# Patient Record
Sex: Male | Born: 1940 | Race: White | Hispanic: No | Marital: Married | Smoking: Never smoker
Health system: Southern US, Community
[De-identification: ages and names within clinical notes are randomized; demographics above are authoritative.]

## PROBLEM LIST (undated history)

## (undated) DIAGNOSIS — E119 Type 2 diabetes mellitus without complications: Secondary | ICD-10-CM

## (undated) DIAGNOSIS — I340 Nonrheumatic mitral (valve) insufficiency: Secondary | ICD-10-CM

## (undated) DIAGNOSIS — I10 Essential (primary) hypertension: Secondary | ICD-10-CM

## (undated) DIAGNOSIS — I2510 Atherosclerotic heart disease of native coronary artery without angina pectoris: Secondary | ICD-10-CM

## (undated) DIAGNOSIS — I509 Heart failure, unspecified: Secondary | ICD-10-CM

## (undated) HISTORY — PX: HERNIA REPAIR: SHX51

## (undated) HISTORY — PX: CARDIAC CATHETERIZATION: SHX172

## (undated) HISTORY — PX: EYE SURGERY: SHX253

---

## 2004-11-21 ENCOUNTER — Emergency Department: Payer: Self-pay | Admitting: Emergency Medicine

## 2005-02-03 ENCOUNTER — Other Ambulatory Visit: Payer: Self-pay

## 2005-02-03 ENCOUNTER — Emergency Department: Payer: Self-pay | Admitting: Unknown Physician Specialty

## 2006-06-30 ENCOUNTER — Emergency Department: Payer: Self-pay | Admitting: Emergency Medicine

## 2007-02-26 ENCOUNTER — Emergency Department: Payer: Self-pay

## 2008-01-16 ENCOUNTER — Other Ambulatory Visit: Payer: Self-pay

## 2008-01-16 ENCOUNTER — Emergency Department: Payer: Self-pay | Admitting: Emergency Medicine

## 2008-05-08 ENCOUNTER — Ambulatory Visit: Payer: Self-pay | Admitting: Urology

## 2008-08-11 ENCOUNTER — Ambulatory Visit: Payer: Self-pay | Admitting: Cardiovascular Disease

## 2009-07-26 ENCOUNTER — Inpatient Hospital Stay: Payer: Self-pay | Admitting: Internal Medicine

## 2009-10-29 ENCOUNTER — Inpatient Hospital Stay: Payer: Self-pay | Admitting: Internal Medicine

## 2012-03-24 ENCOUNTER — Ambulatory Visit: Payer: Self-pay | Admitting: Ophthalmology

## 2012-03-24 LAB — POTASSIUM: Potassium: 4.3 mmol/L (ref 3.5–5.1)

## 2012-04-06 ENCOUNTER — Ambulatory Visit: Payer: Self-pay | Admitting: Ophthalmology

## 2012-05-11 ENCOUNTER — Ambulatory Visit: Payer: Self-pay | Admitting: Ophthalmology

## 2013-07-18 ENCOUNTER — Emergency Department: Payer: Self-pay | Admitting: Emergency Medicine

## 2013-07-18 LAB — URINALYSIS, COMPLETE
Bilirubin,UR: NEGATIVE
Blood: NEGATIVE
GLUCOSE, UR: NEGATIVE mg/dL (ref 0–75)
KETONE: NEGATIVE
LEUKOCYTE ESTERASE: NEGATIVE
Nitrite: NEGATIVE
PH: 5 (ref 4.5–8.0)
RBC,UR: <1 /HPF (ref 0–5)
SPECIFIC GRAVITY: 1.015 (ref 1.003–1.030)
Squamous Epithelial: NONE SEEN
WBC UR: <1 /HPF (ref 0–5)

## 2014-09-26 NOTE — Op Note (Signed)
PATIENT NAME:  Kevin Kelley, [name] MR#:  [id number] DATE OF BIRTH:  September 06, 1940  DATE OF PROCEDURE:  05/11/2012  PREOPERATIVE DIAGNOSIS: Visually significant cataract of the right eye.   POSTOPERATIVE DIAGNOSIS: Visually significant cataract of the right eye.   OPERATIVE PROCEDURE: Cataract extraction by phacoemulsification with implant of intraocular lens to right eye.   SURGEON: Galen ManilaWilliam Leza Apsey, MD.   ANESTHESIA:  1. Managed anesthesia care.  2. Topical tetracaine drops followed by 2% Xylocaine jelly applied in the preoperative holding area.   COMPLICATIONS: None.   TECHNIQUE:  Stop and chop.   DESCRIPTION OF PROCEDURE: The patient was examined and consented in the preoperative holding area where the aforementioned topical anesthesia was applied to the right eye and then brought back to the Operating Room where the right eye was prepped and draped in the usual sterile ophthalmic fashion and a lid speculum was placed. A paracentesis was created with the side port blade and the anterior chamber was filled with viscoelastic. A near clear corneal incision was performed with the steel keratome. A continuous curvilinear capsulorrhexis was performed with a cystotome followed by the capsulorrhexis forceps. Hydrodissection and hydrodelineation were carried out with BSS on a blunt cannula. The lens was removed in a stop and chop technique and the remaining cortical material was removed with the irrigation-aspiration handpiece. The capsular bag was inflated with viscoelastic and the Tecnis ZCB00 23.0-diopter lens, serial number [id number] was placed in the capsular bag without complication. The remaining viscoelastic was removed from the eye with the irrigation-aspiration handpiece. The wounds were hydrated. The anterior chamber was flushed with Miostat and the eye was inflated to physiologic pressure. The wounds were found to be water tight. The eye was dressed with Vigamox. The patient was given protective  glasses to wear throughout the day and a shield with which to sleep tonight. The patient was also given drops with which to begin a drop regimen today and will follow-up with me in one day.  ____________________________ Jerilee FieldWilliam L. Angelys Yetman, MD wlp:slb D: 05/11/2012 20:15:07 ET T: 05/12/2012 08:34:58 ET JOB#: [id number]  cc: Mckaylah Bettendorf L. Demarkis Gheen, MD, <Dictator> Jerilee FieldWILLIAM L Cypher Paule MD ELECTRONICALLY SIGNED 05/13/2012 15:45

## 2014-09-26 NOTE — Op Note (Signed)
PATIENT NAME:  Kevin Kelley, Kevin Kelley MR#:  [id number] DATE OF BIRTH:  [birth year]  DATE OF PROCEDURE:  04/06/2012  PREOPERATIVE DIAGNOSIS: Visually significant cataract of the left eye.   POSTOPERATIVE DIAGNOSIS: Visually significant cataract of the left eye.   OPERATIVE PROCEDURE: Cataract extraction by phacoemulsification with implant of intraocular lens to left eye.   SURGEON: Galen ManilaWilliam Kalai Baca, MD.   ANESTHESIA:  1. Managed anesthesia care.  2. Topical tetracaine drops followed by 2% Xylocaine jelly applied in the preoperative holding area.   COMPLICATIONS: None.   TECHNIQUE:  Stop and chop.   DESCRIPTION OF PROCEDURE: The patient was examined and consented in the preoperative holding area where the aforementioned topical anesthesia was applied to the left eye and then brought back to the Operating Room where the left eye was prepped and draped in the usual sterile ophthalmic fashion and a lid speculum was placed. A paracentesis was created with the side port blade and the anterior chamber was filled with viscoelastic. A near clear corneal incision was performed with the steel keratome. A continuous curvilinear capsulorrhexis was performed with a cystotome followed by the capsulorrhexis forceps. Hydrodissection and hydrodelineation were carried out with BSS on a blunt cannula. The lens was removed in a stop and chop technique and the remaining cortical material was removed with the irrigation-aspiration handpiece. The capsular bag was inflated with viscoelastic and the Tecnis ZCB00 22.5-diopter lens, serial number [id number] was placed in the capsular bag without complication. The remaining viscoelastic was removed from the eye with the irrigation-aspiration handpiece. The wounds were hydrated. The anterior chamber was flushed with Miostat and the eye was inflated to physiologic pressure. The wounds were found to be water tight. The eye was dressed with Vigamox. The patient was given protective glasses  to wear throughout the day and a shield with which to sleep tonight. The patient was also given drops with which to begin a drop regimen today and will follow-up with me in one day.  ____________________________ Kevin FieldWilliam L. Nilton Lave, MD wlp:slb D: 04/06/2012 15:29:47 ET T: 04/06/2012 15:51:18 ET JOB#: [id number]  cc: Kevin Rother L. Zynia Wojtowicz, MD, <Dictator> Kevin FieldWILLIAM L Kshawn Canal MD ELECTRONICALLY SIGNED 04/15/2012 10:23

## 2015-05-07 ENCOUNTER — Encounter: Payer: Self-pay | Admitting: Emergency Medicine

## 2015-05-07 ENCOUNTER — Emergency Department
Admission: EM | Admit: 2015-05-07 | Discharge: 2015-05-07 | Disposition: A | Discharge status: Home / Self Care | Payer: Medicare PPO | Attending: Emergency Medicine | Admitting: Emergency Medicine

## 2015-05-07 ENCOUNTER — Emergency Department: Payer: Medicare PPO

## 2015-05-07 DIAGNOSIS — E119 Type 2 diabetes mellitus without complications: Secondary | ICD-10-CM | POA: Insufficient documentation

## 2015-05-07 DIAGNOSIS — Z7982 Long term (current) use of aspirin: Secondary | ICD-10-CM | POA: Diagnosis not present

## 2015-05-07 DIAGNOSIS — Z79899 Other long term (current) drug therapy: Secondary | ICD-10-CM | POA: Diagnosis not present

## 2015-05-07 DIAGNOSIS — M25551 Pain in right hip: Secondary | ICD-10-CM | POA: Diagnosis present

## 2015-05-07 HISTORY — DX: Type 2 diabetes mellitus without complications: E11.9

## 2015-05-07 HISTORY — DX: Atherosclerotic heart disease of native coronary artery without angina pectoris: I25.10

## 2015-05-07 MED ORDER — TRAMADOL HCL 50 MG PO TABS: 50.0000 mg | ORAL_TABLET | Freq: Four times a day (QID) | ORAL | Status: AC | PRN

## 2015-05-07 NOTE — ED Provider Notes (Signed)
[facility] Emergency Department Provider Note  ____________________________________________  Time seen: Approximately 11:04 AM  I have reviewed the triage vital signs and the nursing notes.   HISTORY  Chief Complaint Hip Pain  HPI Kevin Kelley is a 74 y.o. male is here with  complaint of right hip pain beginning on 4 days ago. Patient states there was no injury to his hip. He states that he was at work on Thursday and was walking more than usual when he developed a limp. His continue to walk since that time. He is tried some over-the-counter medication without any improvement. He denies any previous problems with his hip. Currently patient states that his pain is 10 over 10.     Past Medical History  Diagnosis Date  . Diabetes mellitus without complication (HCC)   . Coronary artery disease     There are no active problems to display for this patient.   Past Surgical History  Procedure Laterality Date  . Eye surgery    . Hernia repair      Current Outpatient Rx  Name  Route  Sig  Dispense  Refill  . amLODipine (NORVASC) 10 MG tablet   Oral   Take 10 mg by mouth daily.         Marland Kitchen aspirin 81 MG tablet   Oral   Take 81 mg by mouth daily.         . furosemide (LASIX) 20 MG tablet   Oral   Take 20 mg by mouth.         Marland Kitchen glipiZIDE (GLUCOTROL) 10 MG tablet   Oral   Take 10 mg by mouth 2 (two) times daily before a meal.         . ibuprofen (ADVIL,MOTRIN) 200 MG tablet   Oral   Take 200 mg by mouth every 6 (six) hours as needed (toke 800 mg at 0700 today 05/07/15).         Marland Kitchen lisinopril (PRINIVIL,ZESTRIL) 20 MG tablet   Oral   Take 20 mg by mouth daily.         . metoprolol (LOPRESSOR) 50 MG tablet   Oral   Take 50 mg by mouth 2 (two) times daily.         Marland Kitchen omeprazole (PRILOSEC) 20 MG capsule   Oral   Take 20 mg by mouth daily.         Marland Kitchen oxybutynin (DITROPAN-XL) 5 MG 24 hr tablet   Oral   Take 5 mg by mouth at  bedtime.         . potassium chloride (K-DUR) 10 MEQ tablet   Oral   Take 10 mEq by mouth daily.         . potassium chloride (K-DUR,KLOR-CON) 10 MEQ tablet   Oral   Take 30 mEq by mouth once.         . pravastatin (PRAVACHOL) 40 MG tablet   Oral   Take 40 mg by mouth daily.         . traMADol (ULTRAM) 50 MG tablet   Oral   Take 1 tablet (50 mg total) by mouth every 6 (six) hours as needed.   20 tablet   0     Allergies Review of patient's allergies indicates no known allergies.  No family history on file.  Social History Social History  Substance Use Topics  . Smoking status: Never Smoker   . Smokeless tobacco: None  . Alcohol Use: None  Review of Systems Constitutional: No fever/chills Eyes: No visual changes. Cardiovascular: Denies chest pain. Respiratory: Denies shortness of breath. Gastrointestinal:  No nausea, no vomiting.  Musculoskeletal: Negative for back pain. Positive right hip pain. Skin: Negative for rash. Neurological: Negative for headaches, focal weakness or numbness.  10-point ROS otherwise negative.  ____________________________________________   PHYSICAL EXAM:  VITAL SIGNS: ED Triage Vitals  Enc Vitals Group     BP 05/07/15 1021 133/63 mmHg     Pulse Rate 05/07/15 1021 51     Resp 05/07/15 1021 18     Temp 05/07/15 1021 97.6 F (36.4 C)     Temp Source 05/07/15 1021 Oral     SpO2 05/07/15 1021 99 %     Weight 05/07/15 1021 198 lb (89.812 kg)     Height 05/07/15 1021 5\' 7"  (1.702 m)     Head Cir --      Peak Flow --      Pain Score 05/07/15 1022 10     Pain Loc --      Pain Edu? --      Excl. in GC? --     Constitutional: Alert and oriented. Well appearing and in no acute distress. Eyes: Conjunctivae are normal. PERRL. EOMI. Head: Atraumatic. Nose: No congestion/rhinnorhea. Neck: No stridor.   Cardiovascular: Normal rate, regular rhythm. Grossly normal heart sounds.  Good peripheral circulation. Respiratory:  Normal respiratory effort.  No retractions. Lungs CTAB. Gastrointestinal: Soft and nontender. No distention. Musculoskeletal: Examination of the right hip but no gross deformity was noted. There is restriction on range of motion in abduction and abduction. Patient is able to flex his knee and range of motion is somewhat uncomfortable but able. No deformity of the knees are noted bilaterally. Neurologic:  Normal speech and language. No gross focal neurologic deficits are appreciated. No gait instability. Skin:  Skin is warm, dry and intact. No rash noted. No erythema or ecchymosis is noted. Psychiatric: Mood and affect are normal. Speech and behavior are normal.  ____________________________________________   LABS (all labs ordered are listed, but only abnormal results are displayed)  Labs Reviewed - No data to display RADIOLOGY  Right hip no acute fracture or dislocation per radiologist. Beaulah CorinI, Rhonda L Summers, personally viewed and evaluated these images (plain radiographs) as part of my medical decision making.  ____________________________________________   PROCEDURES  Procedure(s) performed: None  Critical Care performed: No  ____________________________________________   INITIAL IMPRESSION / ASSESSMENT AND PLAN / ED COURSE  Pertinent labs & imaging results that were available during my care of the patient were reviewed by me and considered in my medical decision making (see chart for details).  Patient is given prescriptions for tramadol as needed for pain. He is follow-up with his primary care doctor if any continued problems. He was also given the name of the orthopedist that was on call today should he decide to follow-up with them as well. He is aware that tramadol could cause drowsiness which may increase his chances of following. ____________________________________________   FINAL CLINICAL IMPRESSION(S) / ED DIAGNOSES  Final diagnoses:  Hip pain, acute, right       Tommi RumpsRhonda L Summers, PA-C 05/07/15 1558  Emily FilbertJonathan E Williams, MD 05/07/15 [phone]

## 2015-05-07 NOTE — ED Notes (Signed)
AAOx3.  Skin warm and dry.  NAD 

## 2015-05-07 NOTE — ED Notes (Signed)
Was at work on Thursday , complaining of right hip pain , no injury recalled , pt ambulating with a mild limp.

## 2015-05-07 NOTE — Discharge Instructions (Signed)
[place] with your [place] in [place] if any continued problems with your hip. Tramadol as needed for pain. The aware this medication could cause drowsiness and that he should not operate machinery or drive.

## 2015-11-27 ENCOUNTER — Emergency Department
Admission: EM | Admit: 2015-11-27 | Discharge: 2015-11-27 | Disposition: A | Discharge status: Home / Self Care | Payer: Medicare PPO | Attending: Emergency Medicine | Admitting: Emergency Medicine

## 2015-11-27 ENCOUNTER — Emergency Department: Payer: Medicare PPO

## 2015-11-27 DIAGNOSIS — I2510 Atherosclerotic heart disease of native coronary artery without angina pectoris: Secondary | ICD-10-CM | POA: Insufficient documentation

## 2015-11-27 DIAGNOSIS — E860 Dehydration: Secondary | ICD-10-CM

## 2015-11-27 DIAGNOSIS — Z79899 Other long term (current) drug therapy: Secondary | ICD-10-CM | POA: Insufficient documentation

## 2015-11-27 DIAGNOSIS — J189 Pneumonia, unspecified organism: Secondary | ICD-10-CM | POA: Insufficient documentation

## 2015-11-27 DIAGNOSIS — Z7982 Long term (current) use of aspirin: Secondary | ICD-10-CM | POA: Insufficient documentation

## 2015-11-27 DIAGNOSIS — R0602 Shortness of breath: Secondary | ICD-10-CM | POA: Diagnosis present

## 2015-11-27 DIAGNOSIS — E119 Type 2 diabetes mellitus without complications: Secondary | ICD-10-CM | POA: Diagnosis not present

## 2015-11-27 LAB — BASIC METABOLIC PANEL
ANION GAP: 10 (ref 5–15)
BUN: 24 mg/dL — AB (ref 6–20)
CALCIUM: 9 mg/dL (ref 8.9–10.3)
CO2: 21 mmol/L — AB (ref 22–32)
Chloride: 96 mmol/L — ABNORMAL LOW (ref 101–111)
Creatinine, Ser: 1.39 mg/dL — ABNORMAL HIGH (ref 0.61–1.24)
GFR calc Af Amer: 56 mL/min — ABNORMAL LOW (ref >60)
GFR, EST NON AFRICAN AMERICAN: 48 mL/min — AB (ref >60)
GLUCOSE: 74 mg/dL (ref 65–99)
POTASSIUM: 5.1 mmol/L (ref 3.5–5.1)
Sodium: 127 mmol/L — ABNORMAL LOW (ref 135–145)

## 2015-11-27 LAB — CBC
HEMATOCRIT: 36.4 % — AB (ref 40.0–52.0)
HEMOGLOBIN: 12.8 g/dL — AB (ref 13.0–18.0)
MCH: 31.2 pg (ref 26.0–34.0)
MCHC: 35.1 g/dL (ref 32.0–36.0)
MCV: 89 fL (ref 80.0–100.0)
Platelets: 267 10*3/uL (ref 150–440)
RBC: 4.09 MIL/uL — ABNORMAL LOW (ref 4.40–5.90)
RDW: 13.3 % (ref 11.5–14.5)
WBC: 8.8 10*3/uL (ref 3.8–10.6)

## 2015-11-27 LAB — TROPONIN I: Troponin I: <0.03 ng/mL (ref <0.031)

## 2015-11-27 MED ORDER — SODIUM CHLORIDE 0.9 % IV BOLUS (SEPSIS)
1000.0000 mL | Freq: Once | INTRAVENOUS | Status: AC
  Administered 2015-11-27: 1000 mL via INTRAVENOUS

## 2015-11-27 MED ORDER — ONDANSETRON 4 MG PO TBDP: 4.0000 mg | ORAL_TABLET | Freq: Three times a day (TID) | ORAL | Status: DC | PRN

## 2015-11-27 MED ORDER — AZITHROMYCIN 500 MG PO TABS
500.0000 mg | ORAL_TABLET | Freq: Once | ORAL | Status: AC
Start: 2015-11-27 — End: 2015-11-27
  Administered 2015-11-27: 500 mg via ORAL
  Filled 2015-11-27: qty 1

## 2015-11-27 MED ORDER — AZITHROMYCIN 250 MG PO TABS: ORAL_TABLET | ORAL | Status: AC

## 2015-11-27 NOTE — ED Notes (Signed)
Patient presents to the ED with shortness of breath and chest pain that began yesterday evening.  Patient states, "when I cough it feels like my chest is going to explode."  Patient reports history of pneumonia.  Patient states cough has produced green phlegm.  Patient is in no obvious distress at this time.

## 2015-11-27 NOTE — ED Notes (Signed)
Pt discharged to home.  Family member driving.  Discharge instructions reviewed.  Verbalized understanding.  No questions or concerns at this time.  Teach back verified.  Pt in NAD.  No items left in ED.   

## 2015-11-27 NOTE — ED Provider Notes (Signed)
[name] Emergency Department Provider Note  ____________________________________________  Time seen: 5:50 PM  I have reviewed the triage vital signs and the nursing notes.   HISTORY  Chief Complaint Shortness of Breath    HPI Kevin Kelley is a 75 y.o. male who complains of shortness of breath and productive cough and chest discomfort only when coughing for the past 2 or 3 days. No fevers chills or night sweats. Did vomit once today, has some nausea ongoing but no diarrhea or abdominal pain. No exertional symptoms. Not pleuritic. Pain is described as aching, no other aggravating or alleviating factors. Nonradiating. Moderate intensity. Intermittent episodes last just a few seconds.  Patient reports normal oral intake.   Past Medical History  Diagnosis Date  . Diabetes mellitus without complication (HCC)   . Coronary artery disease      There are no active problems to display for this patient.    Past Surgical History  Procedure Laterality Date  . Eye surgery    . Hernia repair       Current Outpatient Rx  Name  Route  Sig  Dispense  Refill  . amLODipine (NORVASC) 10 MG tablet   Oral   Take 10 mg by mouth daily.         Marland Kitchen aspirin 81 MG tablet   Oral   Take 81 mg by mouth daily.         Marland Kitchen azithromycin (ZITHROMAX Z-PAK) 250 MG tablet      Take 2 tablets (500 mg) on  Day 1,  followed by 1 tablet (250 mg) once daily on Days 2 through 5.   6 each   0   . furosemide (LASIX) 20 MG tablet   Oral   Take 20 mg by mouth.         Marland Kitchen glipiZIDE (GLUCOTROL) 10 MG tablet   Oral   Take 10 mg by mouth 2 (two) times daily before a meal.         . ibuprofen (ADVIL,MOTRIN) 200 MG tablet   Oral   Take 200 mg by mouth every 6 (six) hours as needed (toke 800 mg at 0700 today 05/07/15).         Marland Kitchen lisinopril (PRINIVIL,ZESTRIL) 20 MG tablet   Oral   Take 20 mg by mouth daily.         . metoprolol (LOPRESSOR) 50 MG tablet   Oral  Take 50 mg by mouth 2 (two) times daily.         Marland Kitchen omeprazole (PRILOSEC) 20 MG capsule   Oral   Take 20 mg by mouth daily.         . ondansetron (ZOFRAN ODT) 4 MG disintegrating tablet   Oral   Take 1 tablet (4 mg total) by mouth every 8 (eight) hours as needed for nausea or vomiting.   20 tablet   0   . oxybutynin (DITROPAN-XL) 5 MG 24 hr tablet   Oral   Take 5 mg by mouth at bedtime.         . potassium chloride (K-DUR) 10 MEQ tablet   Oral   Take 10 mEq by mouth daily.         . potassium chloride (K-DUR,KLOR-CON) 10 MEQ tablet   Oral   Take 30 mEq by mouth once.         . pravastatin (PRAVACHOL) 40 MG tablet   Oral   Take 40 mg by mouth daily.         Marland Kitchen  traMADol (ULTRAM) 50 MG tablet   Oral   Take 1 tablet (50 mg total) by mouth every 6 (six) hours as needed.   20 tablet   0      Allergies Review of patient's allergies indicates no known allergies.   No family history on file.  Social History Social History  Substance Use Topics  . Smoking status: Never Smoker   . Smokeless tobacco: Not on file  . Alcohol Use: Not on file    Review of Systems  Constitutional:   No fever or chills.  Eyes:   No vision changes.  ENT:   No sore throat. No rhinorrhea. Cardiovascular:   Positive as above chest pain. Respiratory:   Positive shortness of breath and productive cough. Gastrointestinal:   Negative for abdominal pain, vomiting and diarrhea.  Genitourinary:   Negative for dysuria or difficulty urinating. Musculoskeletal:   Negative for focal pain or swelling Neurological:   Negative for headaches 10-point ROS otherwise negative.  ____________________________________________   PHYSICAL EXAM:  VITAL SIGNS: ED Triage Vitals  Enc Vitals Group     BP 11/27/15 1428 105/80 mmHg     Pulse Rate 11/27/15 1428 61     Resp 11/27/15 1428 18     Temp 11/27/15 1428 98 F (36.7 C)     Temp Source 11/27/15 1428 Oral     SpO2 11/27/15 1428 97 %      Weight 11/27/15 1428 199 lb (90.266 kg)     Height 11/27/15 1428  (1.702 m)     Head Cir --      Peak Flow --      Pain Score 11/27/15 1726 9     Pain Loc --      Pain Edu? --      Excl. in GC? --     Vital signs reviewed, nursing assessments reviewed.   Constitutional:   Alert and oriented. Well appearing and in no distress. Eyes:   No scleral icterus. No conjunctival pallor. PERRL. EOMI.  No nystagmus. ENT   Head:   Normocephalic and atraumatic.   Nose:   No congestion/rhinnorhea. No septal hematoma   Mouth/Throat:   MMM, no pharyngeal erythema. No peritonsillar mass.    Neck:   No stridor. No SubQ emphysema. No meningismus. Hematological/Lymphatic/Immunilogical:   No cervical lymphadenopathy. Cardiovascular:   RRR. Symmetric bilateral radial and DP pulses.  No murmurs.  Respiratory:   Normal respiratory effort without tachypnea nor retractions. Breath sounds are  equal bilaterally. Faint crackles in the left base. No other crackles or wheezes. Gastrointestinal:   Soft and nontender. Non distended. There is no CVA tenderness.  No rebound, rigidity, or guarding. Genitourinary:   deferred Musculoskeletal:   Nontender with normal range of motion in all extremities. No joint effusions.  No lower extremity tenderness.  No edema. Neurologic:   Normal speech and language.  CN 2-10 normal. Motor grossly intact. No gross focal neurologic deficits are appreciated.  Skin:    Skin is warm, dry and intact. No rash noted.  No petechiae, purpura, or bullae.  ____________________________________________    LABS (pertinent positives/negatives) (all labs ordered are listed, but only abnormal results are displayed) Labs Reviewed  BASIC METABOLIC PANEL - Abnormal; Notable for the following:    Sodium 127 (*)    Chloride 96 (*)    CO2 21 (*)    BUN 24 (*)    Creatinine, Ser 1.39 (*)    GFR calc non Af Amer 48 (*)  GFR calc Af Amer 56 (*)    All other components within  normal limits  CBC - Abnormal; Notable for the following:    RBC 4.09 (*)    Hemoglobin 12.8 (*)    HCT 36.4 (*)    All other components within normal limits  TROPONIN I   ____________________________________________   EKG  Interpreted by me Sinus bradycardia rate of 54, normal axis. Slightly prolonged PR interval at 208 ms. Right bundle branch block. Normal ST segments and T waves.  ____________________________________________    RADIOLOGY  Chest x-ray unremarkable  ____________________________________________   PROCEDURES   ____________________________________________   INITIAL IMPRESSION / ASSESSMENT AND PLAN / ED COURSE  Pertinent labs & imaging results that were available during my care of the patient were reviewed by me and considered in my medical decision making (see chart for details).  Patient well appearing no acute distress. Presents with symptoms of bronchitis versus pneumonia. Physical exam is consistent with focal infection, so especially with history of diabetes 02 the patient for pneumonia. We'll give azithromycin. Labs are significant for a sodium level of 127. Review of the EMR shows that his baseline sodium level is about 135. Patient denies any weakness or loss of balance or coordination or falls headaches or confusion. No seizures. The hyponatremia appears be symptomatically and was likely related to mild dehydration. Patient is given IV saline after which he does feel much better and more energetic. Started on azithromycin, follow up with primary care.Considering the patient's symptoms, medical history, and physical examination today, I have low suspicion for ACS, PE, TAD, pneumothorax, carditis, mediastinitis, CHF, or sepsis.       ____________________________________________   FINAL CLINICAL IMPRESSION(S) / ED DIAGNOSES  Final diagnoses:  Mild dehydration  Community acquired pneumonia       Portions of this note were generated with  dragon dictation software. Dictation errors may occur despite best attempts at proofreading.   Sharman CheekPhillip Devonne Lalani, MD 11/27/15 1924

## 2015-11-27 NOTE — ED Notes (Signed)
Pt reports a cough, short of breath and chest pain for 2 days.  Nonsmoker.  No fever.  Vomited x 1 today.  No abd pain.  No diaphoresis. Pt alert.

## 2015-11-27 NOTE — Discharge Instructions (Signed)
Community-Acquired Pneumonia, Adult Pneumonia is an infection of the lungs. There are different types of pneumonia. One type can develop while a person is in a hospital. A different type, called community-acquired pneumonia, develops in people who are not, or have not recently been, in the hospital or other health care facility.  CAUSES Pneumonia may be caused by bacteria, viruses, or funguses. Community-acquired pneumonia is often caused by Streptococcus pneumonia bacteria. These bacteria are often passed from one person to another by breathing in droplets from the cough or sneeze of an infected person. RISK FACTORS The condition is more likely to develop in:  People who havechronic diseases, such as chronic obstructive pulmonary disease (COPD), asthma, congestive heart failure, cystic fibrosis, diabetes, or kidney disease.  People who haveearly-stage or late-stage HIV.  People who havesickle cell disease.  People who havehad their spleen removed (splenectomy).  People who havepoor Human resources officer.  People who havemedical conditions that increase the risk of breathing in (aspirating) secretions their own mouth and nose.   People who havea weakened immune system (immunocompromised).  People who smoke.  People whotravel to areas where pneumonia-causing germs commonly exist.  People whoare around animal habitats or animals that have pneumonia-causing germs, including birds, bats, rabbits, cats, and farm animals. SYMPTOMS Symptoms of this condition include:  Adry cough.  A wet (productive) cough.  Fever.  Sweating.  Chest pain, especially when breathing deeply or coughing.  Rapid breathing or difficulty breathing.  Shortness of breath.  Shaking chills.  Fatigue.  Muscle aches. DIAGNOSIS Your health care provider will take a medical history and perform a physical exam. You may also have other tests, including:  Imaging studies of your chest, including  X-rays.  Tests to check your blood oxygen level and other blood gases.  Other tests on blood, mucus (sputum), fluid around your lungs (pleural fluid), and urine. If your pneumonia is severe, other tests may be done to identify the specific cause of your illness. TREATMENT The type of treatment that you receive depends on many factors, such as the cause of your pneumonia, the medicines you take, and other medical conditions that you have. For most adults, treatment and recovery from pneumonia may occur at home. In some cases, treatment must happen in a hospital. Treatment may include:  Antibiotic medicines, if the pneumonia was caused by bacteria.  Antiviral medicines, if the pneumonia was caused by a virus.  Medicines that are given by mouth or through an IV tube.  Oxygen.  Respiratory therapy. Although rare, treating severe pneumonia may include:  Mechanical ventilation. This is done if you are not breathing well on your own and you cannot maintain a safe blood oxygen level.  Thoracentesis. This procedureremoves fluid around one lung or both lungs to help you breathe better. HOME CARE INSTRUCTIONS  Take over-the-counter and prescription medicines only as told by your health care provider.  Only takecough medicine if you are losing sleep. Understand that cough medicine can prevent your body's natural ability to remove mucus from your lungs.  If you were prescribed an antibiotic medicine, take it as told by your health care provider. Do not stop taking the antibiotic even if you start to feel better.  Sleep in a semi-upright position at night. Try sleeping in a reclining chair, or place a few pillows under your head.  Do not use tobacco products, including cigarettes, chewing tobacco, and e-cigarettes. If you need help quitting, ask your health care provider.  Drink enough water to keep your urine  clear or pale yellow. This will help to thin out mucus secretions in your  lungs. PREVENTION There are ways that you can decrease your risk of developing community-acquired pneumonia. Consider getting a pneumococcal vaccine if:  You are older than 75 years of age.  You are older than 75 years of age and are undergoing cancer treatment, have chronic lung disease, or have other medical conditions that affect your immune system. Ask your health care provider if this applies to you. There are different types and schedules of pneumococcal vaccines. Ask your health care provider which vaccination option is best for you. You may also prevent community-acquired pneumonia if you take these actions:  Get an influenza vaccine every year. Ask your health care provider which type of influenza vaccine is best for you.  Go to the dentist on a regular basis.  Wash your hands often. Use hand sanitizer if soap and water are not available. SEEK MEDICAL CARE IF:  You have a fever.  You are losing sleep because you cannot control your cough with cough medicine. SEEK IMMEDIATE MEDICAL CARE IF:  You have worsening shortness of breath.  You have increased chest pain.  Your sickness becomes worse, especially if you are an older adult or have a weakened immune system.  You cough up blood.   This information is not intended to replace advice given to you by your health care provider. Make sure you discuss any questions you have with your health care provider.   Document Released: 05/26/2005 Document Revised: 02/14/2015 Document Reviewed: 09/20/2014 Elsevier Interactive Patient Education 2016 [employer]  Dehydration, Adult Dehydration means your body does not have as much fluid or water as it needs. It happens when you take in less fluid than you lose. Your kidneys, brain, and heart will not work properly without the right amount of fluids.  Dehydration can range from mild to severe. It should be treated right away to help prevent it from becoming severe. HOME CARE  Drink  enough fluid to keep your pee (urine) clear or pale yellow.  Drink water or fluid slowly by taking small sips. You can also try sucking on ice cubes.  Have food or drinks that contain electrolytes. Examples include bananas and sports drinks.  Take over-the-counter and prescription medicines only as told by your doctor.  Prepare oral rehydration solution (ORS) according to the instructions that came with it. Take sips of ORS every 5 minutes until your pee returns to normal.  If you are throwing up (vomiting) or have watery poop (diarrhea), keep trying to drink water, ORS, or both.  If you have watery poop, avoid:  Drinks with caffeine.  Fruit juice.  Milk.  Carbonated soft drinks.  Do not take salt tablets. This can lead to having too much sodium in your body (hypernatremia). GET HELP IF:  You cannot eat or drink without throwing up.  You have had mild watery poop for longer than 24 hours.  You have a fever. GET HELP RIGHT AWAY IF:   You have very strong thirst.  You have very bad watery poop.  You have not peed in 6-8 hours, or you have peed only a small amount of very dark pee.  You have shriveled skin.  You are dizzy, confused, or both.   This information is not intended to replace advice given to you by your health care provider. Make sure you discuss any questions you have with your health care provider.   Document Released: 03/22/2009 Document  Revised: 02/14/2015 Document Reviewed: 10/11/2014 Elsevier Interactive Patient Education 2016 [employer]  Hyponatremia Hyponatremia is when the amount of salt (sodium) in your blood is too low. When sodium levels are low, your cells absorb extra water and they swell. The swelling happens throughout the body, but it mostly affects the brain. CAUSES This condition may be caused by:  Heart, kidney, or liver problems.  Thyroid problems.  Adrenal gland problems.  Metabolic conditions, such as syndrome of  inappropriate antidiuretic hormone (SIADH).  Severe vomiting and diarrhea.  Certain medicines or illegal drugs.  Dehydration.  Drinking too much water.  Eating a diet that is low in sodium.  Large burns on your body.  Sweating. RISK FACTORS This condition is more likely to develop in people who:  Have long-term (chronic) kidney disease.  Have heart failure.  Have a medical condition that causes frequent or excessive diarrhea.  Have metabolic conditions, such as Addison disease or SIADH.  Take certain medicines that affect the sodium and fluid balance in the blood. Some of these medicine types include:  Diuretics.  NSAIDs.  Some opioid pain medicines.  Some antidepressants.  Some seizure prevention medicines. SYMPTOMS  Symptoms of this condition include:  Nausea and vomiting.  Confusion.  Lethargy.  Agitation.  Headache.  Seizures.  Unconsciousness.  Appetite loss.  Muscle weakness and cramping.  Feeling weak or light-headed.  Having a rapid heart rate.  Fainting, in severe cases. DIAGNOSIS This condition is diagnosed with a medical history and physical exam. You will also have other tests, including:  Blood tests.  Urine tests. TREATMENT Treatment for this condition depends on the cause. Treatment may include:  Fluids given through an IV tube that is inserted into one of your veins.  Medicines to correct the sodium imbalance. If medicines are causing the condition, the medicines will need to be adjusted.  Limiting water or fluid intake to get the correct sodium balance. HOME CARE INSTRUCTIONS  Take medicines only as directed by your health care provider. Many medicines can make this condition worse. Talk with your health care provider about any medicines that you are currently taking.  Carefully follow a recommended diet as directed by your health care provider.  Carefully follow instructions from your health care provider about fluid  restrictions.  Keep all follow-up visits as directed by your health care provider. This is important.  Do not drink alcohol. SEEK MEDICAL CARE IF:  You develop worsening nausea, fatigue, headache, confusion, or weakness.  Your symptoms go away and then return.  You have problems following the recommended diet. SEEK IMMEDIATE MEDICAL CARE IF:  You have a seizure.  You faint.  You have ongoing diarrhea or vomiting.   This information is not intended to replace advice given to you by your health care provider. Make sure you discuss any questions you have with your health care provider.   Document Released: 05/16/2002 Document Revised: 10/10/2014 Document Reviewed: 06/15/2014 Elsevier Interactive Patient Education [employer]

## 2016-11-02 ENCOUNTER — Emergency Department: Payer: Medicare PPO

## 2016-11-02 ENCOUNTER — Encounter: Payer: Self-pay | Admitting: *Deleted

## 2016-11-02 ENCOUNTER — Emergency Department
Admission: EM | Admit: 2016-11-02 | Discharge: 2016-11-03 | Disposition: A | Discharge status: Home / Self Care | Payer: Medicare PPO | Attending: Emergency Medicine | Admitting: Emergency Medicine

## 2016-11-02 DIAGNOSIS — F17220 Nicotine dependence, chewing tobacco, uncomplicated: Secondary | ICD-10-CM | POA: Diagnosis not present

## 2016-11-02 DIAGNOSIS — I509 Heart failure, unspecified: Secondary | ICD-10-CM | POA: Insufficient documentation

## 2016-11-02 DIAGNOSIS — R0789 Other chest pain: Secondary | ICD-10-CM | POA: Diagnosis present

## 2016-11-02 DIAGNOSIS — E119 Type 2 diabetes mellitus without complications: Secondary | ICD-10-CM | POA: Insufficient documentation

## 2016-11-02 DIAGNOSIS — I2510 Atherosclerotic heart disease of native coronary artery without angina pectoris: Secondary | ICD-10-CM | POA: Diagnosis not present

## 2016-11-02 DIAGNOSIS — Z7984 Long term (current) use of oral hypoglycemic drugs: Secondary | ICD-10-CM | POA: Insufficient documentation

## 2016-11-02 DIAGNOSIS — Z7982 Long term (current) use of aspirin: Secondary | ICD-10-CM | POA: Diagnosis not present

## 2016-11-02 DIAGNOSIS — R079 Chest pain, unspecified: Secondary | ICD-10-CM

## 2016-11-02 HISTORY — DX: Nonrheumatic mitral (valve) insufficiency: I34.0

## 2016-11-02 HISTORY — DX: Heart failure, unspecified: I50.9

## 2016-11-02 LAB — BASIC METABOLIC PANEL
ANION GAP: 9 (ref 5–15)
BUN: 26 mg/dL — ABNORMAL HIGH (ref 6–20)
CHLORIDE: 105 mmol/L (ref 101–111)
CO2: 23 mmol/L (ref 22–32)
Calcium: 8.6 mg/dL — ABNORMAL LOW (ref 8.9–10.3)
Creatinine, Ser: 1.22 mg/dL (ref 0.61–1.24)
GFR calc Af Amer: >60 mL/min (ref >60)
GFR, EST NON AFRICAN AMERICAN: 56 mL/min — AB (ref >60)
GLUCOSE: 206 mg/dL — AB (ref 65–99)
POTASSIUM: 4 mmol/L (ref 3.5–5.1)
Sodium: 137 mmol/L (ref 135–145)

## 2016-11-02 LAB — CBC
HEMATOCRIT: 35.5 % — AB (ref 40.0–52.0)
HEMOGLOBIN: 12.2 g/dL — AB (ref 13.0–18.0)
MCH: 31 pg (ref 26.0–34.0)
MCHC: 34.5 g/dL (ref 32.0–36.0)
MCV: 89.8 fL (ref 80.0–100.0)
Platelets: 253 10*3/uL (ref 150–440)
RBC: 3.95 MIL/uL — ABNORMAL LOW (ref 4.40–5.90)
RDW: 14 % (ref 11.5–14.5)
WBC: 9.1 10*3/uL (ref 3.8–10.6)

## 2016-11-02 LAB — TROPONIN I: Troponin I: <0.03 ng/mL (ref <0.03)

## 2016-11-02 MED ORDER — IOPAMIDOL (ISOVUE-370) INJECTION 76%
100.0000 mL | Freq: Once | INTRAVENOUS | Status: AC | PRN
  Administered 2016-11-02: 100 mL via INTRAVENOUS

## 2016-11-02 MED ORDER — ACETAMINOPHEN 500 MG PO TABS
1000.0000 mg | ORAL_TABLET | Freq: Once | ORAL | Status: AC
  Administered 2016-11-02: 1000 mg via ORAL
  Filled 2016-11-02: qty 2

## 2016-11-02 MED ORDER — ASPIRIN 81 MG PO CHEW
324.0000 mg | CHEWABLE_TABLET | Freq: Once | ORAL | Status: AC
  Administered 2016-11-02: 324 mg via ORAL
  Filled 2016-11-02: qty 4

## 2016-11-02 NOTE — ED Triage Notes (Signed)
Pt to ED reporting right sided chest pain beginning 1 hour ago. Pt reports pain as a pressure and a tightness with moments of sharp pain. Pt reports pain radiates to both arms and into back. Pain increased upon palpation of chest. Pt denies having cough or fever. No SOB, dizziness or NV. PT reports having taken and 81 mg Aspirin today but denies having nitro at home.

## 2016-11-02 NOTE — ED Notes (Signed)
Patient transported to CT 

## 2016-11-03 LAB — CK: CK TOTAL: 109 U/L (ref 49–397)

## 2016-11-03 LAB — TROPONIN I

## 2016-11-03 NOTE — ED Provider Notes (Signed)
[facility] Emergency Department Provider Note  ____________________________________________  Time seen: Approximately 12:47 AM  I have reviewed the triage vital signs and the nursing notes.   HISTORY  Chief Complaint Chest Pain   HPI Kevin Kelley is a 76 y.o. male with a history of coronary artery disease status post MI, CHF, diabetes, hypertension who presents for evaluation of chest pain. Patient reports that he was sitting watching TV when he developed chest pain that he describes as a dull achy pain located diffusely across his chest, worse on the right upper chest radiating to bilateral arms. The pain is worse with movement of his arms or palpation of his chest. He reports that he was sitting on the couch watching TV and the pain started. He denies lifting heavy things are doing any type of exercise over the last 24 hours with his arms. Currently the pain is 5 out of 10. He denies diaphoresis, nausea, vomiting, shortness of breath. He reports that the pain is very different than his prior MIs. The pain has been constant for several hours  Past Medical History:  Diagnosis Date  . CHF (congestive heart failure) (HCC)   . Coronary artery disease   . Diabetes mellitus without complication (HCC)   . MI (mitral incompetence)    three times    There are no active problems to display for this patient.   Past Surgical History:  Procedure Laterality Date  . CARDIAC CATHETERIZATION     twice  . EYE SURGERY    . HERNIA REPAIR      Prior to Admission medications   Medication Sig Start Date End Date Taking? Authorizing Provider  amLODipine (NORVASC) 10 MG tablet Take 10 mg by mouth daily.    [provider]  aspirin 81 MG tablet Take 81 mg by mouth daily.    [provider]  azithromycin (ZITHROMAX Z-PAK) 250 MG tablet Take 2 tablets (500 mg) on  Day 1,  followed by 1 tablet (250 mg) once daily on Days 2 through 5. 11/27/15   Sharman CheekStafford,  Phillip, MD  furosemide (LASIX) 20 MG tablet Take 20 mg by mouth.    [provider]  glipiZIDE (GLUCOTROL) 10 MG tablet Take 10 mg by mouth 2 (two) times daily before a meal.    [provider]  ibuprofen (ADVIL,MOTRIN) 200 MG tablet Take 200 mg by mouth every 6 (six) hours as needed (toke 800 mg at 0700 today 05/07/15).    [provider]  lisinopril (PRINIVIL,ZESTRIL) 20 MG tablet Take 20 mg by mouth daily.    [provider]  metoprolol (LOPRESSOR) 50 MG tablet Take 50 mg by mouth 2 (two) times daily.    [provider]  omeprazole (PRILOSEC) 20 MG capsule Take 20 mg by mouth daily.    [provider]  ondansetron (ZOFRAN ODT) 4 MG disintegrating tablet Take 1 tablet (4 mg total) by mouth every 8 (eight) hours as needed for nausea or vomiting. 11/27/15   Sharman CheekStafford, Phillip, MD  oxybutynin (DITROPAN-XL) 5 MG 24 hr tablet Take 5 mg by mouth at bedtime.    [provider]  potassium chloride (K-DUR) 10 MEQ tablet Take 10 mEq by mouth daily.    [provider]  potassium chloride (K-DUR,KLOR-CON) 10 MEQ tablet Take 30 mEq by mouth once.    [provider]  pravastatin (PRAVACHOL) 40 MG tablet Take 40 mg by mouth daily.    [provider]  traMADol Janean Sark(ULTRAM)  50 MG tablet Take 1 tablet (50 mg total) by mouth every 6 (six) hours as needed. 05/07/15   Tommi Rumps, PA-C    Allergies Patient has no known allergies.  History reviewed. No pertinent family history.  Social History Social History  Substance Use Topics  . Smoking status: Never Smoker  . Smokeless tobacco: Current User    Types: Chew  . Alcohol use No    Review of Systems  Constitutional: Negative for fever. Eyes: Negative for visual changes. ENT: Negative for sore throat. Neck: No neck pain  Cardiovascular: + chest pain. Respiratory: Negative for shortness of breath. Gastrointestinal: Negative for abdominal pain, vomiting or  diarrhea. Genitourinary: Negative for dysuria. Musculoskeletal: Negative for back pain. Skin: Negative for rash. Neurological: Negative for headaches, weakness or numbness. Psych: No SI or HI  ____________________________________________   PHYSICAL EXAM:  VITAL SIGNS: ED Triage Vitals  Enc Vitals Group     BP 11/02/16 2206 136/63     Pulse Rate 11/02/16 2206 65     Resp 11/02/16 2206 18     Temp 11/02/16 2206 98.3 F (36.8 C)     Temp Source 11/02/16 2206 Oral     SpO2 11/02/16 2206 98 %     Weight 11/02/16 2206 198 lb (89.8 kg)     Height 11/02/16 2206 5\' 7"  (1.702 m)     Head Circumference --      Peak Flow --      Pain Score 11/02/16 2205 8     Pain Loc --      Pain Edu? --      Excl. in GC? --     Constitutional: Alert and oriented. Well appearing and in no apparent distress. HEENT:      Head: Normocephalic and atraumatic.         Eyes: Conjunctivae are normal. Sclera is non-icteric.       Mouth/Throat: Mucous membranes are moist.       Neck: Supple with no signs of meningismus. Cardiovascular: Regular rate and rhythm. No murmurs, gallops, or rubs. 2+ symmetrical distal pulses are present in all extremities. No JVD. Pain is reproducible with palpation of the chest muscles diffusely and with ROM of b/l shoulders. Also reproducible with palpation of the b/l arm muscles  Respiratory: Normal respiratory effort. Lungs are clear to auscultation bilaterally. No wheezes, crackles, or rhonchi.  Gastrointestinal: Soft, non tender, and non distended with positive bowel sounds. No rebound or guarding. Genitourinary: No CVA tenderness. Musculoskeletal: Nontender with normal range of motion in all extremities. No edema, cyanosis, or erythema of extremities. Neurologic: Normal speech and language. Face is symmetric. Moving all extremities. No gross focal neurologic deficits are appreciated. Skin: Skin is warm, dry and intact. No rash noted. Psychiatric: Mood and affect are normal.  Speech and behavior are normal.  ____________________________________________   LABS (all labs ordered are listed, but only abnormal results are displayed)  Labs Reviewed  BASIC METABOLIC PANEL - Abnormal; Notable for the following:       Result Value   Glucose, Bld 206 (*)    BUN 26 (*)    Calcium 8.6 (*)    GFR calc non Af Amer 56 (*)    All other components within normal limits  CBC - Abnormal; Notable for the following:    RBC 3.95 (*)    Hemoglobin 12.2 (*)    HCT 35.5 (*)    All other components within normal limits  TROPONIN I  TROPONIN I  CK   ____________________________________________  EKG  ED ECG REPORT I, Nita Sickle, the attending physician, personally viewed and interpreted this ECG.  Normal sinus rhythm, rate of 65, normal PR and QTc intervals, right bundle branch block, no ST elevations or depressions, T-wave inversions in anterior leads. EKG is unchanged from prior from 2017 ____________________________________________  RADIOLOGY  CTA dissection: Negative  ____________________________________________   PROCEDURES  Procedure(s) performed: None Procedures Critical Care performed:  None ____________________________________________   INITIAL IMPRESSION / ASSESSMENT AND PLAN / ED COURSE  76 y.o. male with a history of coronary artery disease status post MI, CHF, diabetes, hypertension who presents for evaluation of constant chest pain for a few hours which radiates to bilateral arms and it is reproducible on palpation of muscles of the arm, chest, and upper back. Based on patient's age and comorbidity a CT of the chest was done to rule out PE or dissection which was negative. His EKG shows no ischemic changes. First troponin is negative. Second troponin is due at 1 AM. With a negative CT, EKG, serial cardiac enzymes I do believe that pain is mostly muscular skeletal due to reproducibility with palpation and movement of bilateral arms. Total CK has  been ordered to rule out rhabdo. Patient was given aspirin and Tylenol for the pain. Last stress test in 2011, patient will need close f/u with cardiology.  Clinical Course as of Nov 03 199  Mon Nov 03, 2016  0159 CT negative for dissection or PE. Troponin 2 is negative. Patient's pain fully resolved with 1000 mg of Tylenol. Patient is to be discharged home with close follow-up with his cardiologist on Tuesday. At this time I do believe patient's pain is muscular in nature.  [CV]    Clinical Course User Index [CV] Nita Sickle, MD    Pertinent labs & imaging results that were available during my care of the patient were reviewed by me and considered in my medical decision making (see chart for details).    ____________________________________________   FINAL CLINICAL IMPRESSION(S) / ED DIAGNOSES  Final diagnoses:  Chest pain, unspecified type      NEW MEDICATIONS STARTED DURING THIS VISIT:  New Prescriptions   No medications on file     Note:  This document was prepared using Dragon voice recognition software and may include unintentional dictation errors.    Don Perking, [place], MD 11/03/16 [phone]

## 2016-11-03 NOTE — Discharge Instructions (Signed)
Take 1000mg  of tylenol every 8 hours for pain. Follow-up with your cardiologist on Tuesday. Return to the emergency room if you have new or worsening chest pain, shortness of breath, palpitations, or any new symptoms concerning to you.

## 2016-11-28 ENCOUNTER — Emergency Department: Payer: Medicare PPO

## 2016-11-28 ENCOUNTER — Observation Stay
Admission: EM | Admit: 2016-11-28 | Discharge: 2016-11-29 | Disposition: A | Discharge status: Home / Self Care | Payer: Medicare PPO | Attending: Internal Medicine | Admitting: Internal Medicine

## 2016-11-28 DIAGNOSIS — I110 Hypertensive heart disease with heart failure: Secondary | ICD-10-CM | POA: Insufficient documentation

## 2016-11-28 DIAGNOSIS — N289 Disorder of kidney and ureter, unspecified: Secondary | ICD-10-CM | POA: Diagnosis not present

## 2016-11-28 DIAGNOSIS — E875 Hyperkalemia: Secondary | ICD-10-CM | POA: Diagnosis present

## 2016-11-28 DIAGNOSIS — Z7982 Long term (current) use of aspirin: Secondary | ICD-10-CM | POA: Insufficient documentation

## 2016-11-28 DIAGNOSIS — E871 Hypo-osmolality and hyponatremia: Secondary | ICD-10-CM | POA: Insufficient documentation

## 2016-11-28 DIAGNOSIS — R1011 Right upper quadrant pain: Secondary | ICD-10-CM | POA: Diagnosis present

## 2016-11-28 DIAGNOSIS — E785 Hyperlipidemia, unspecified: Secondary | ICD-10-CM | POA: Diagnosis not present

## 2016-11-28 DIAGNOSIS — K529 Noninfective gastroenteritis and colitis, unspecified: Secondary | ICD-10-CM | POA: Diagnosis not present

## 2016-11-28 DIAGNOSIS — Z794 Long term (current) use of insulin: Secondary | ICD-10-CM | POA: Diagnosis not present

## 2016-11-28 DIAGNOSIS — E119 Type 2 diabetes mellitus without complications: Secondary | ICD-10-CM | POA: Insufficient documentation

## 2016-11-28 DIAGNOSIS — E860 Dehydration: Secondary | ICD-10-CM

## 2016-11-28 DIAGNOSIS — I509 Heart failure, unspecified: Secondary | ICD-10-CM | POA: Diagnosis not present

## 2016-11-28 DIAGNOSIS — I2510 Atherosclerotic heart disease of native coronary artery without angina pectoris: Secondary | ICD-10-CM | POA: Insufficient documentation

## 2016-11-28 DIAGNOSIS — R112 Nausea with vomiting, unspecified: Secondary | ICD-10-CM

## 2016-11-28 DIAGNOSIS — R079 Chest pain, unspecified: Secondary | ICD-10-CM | POA: Diagnosis not present

## 2016-11-28 HISTORY — DX: Essential (primary) hypertension: I10

## 2016-11-28 LAB — HEPATIC FUNCTION PANEL
ALK PHOS: 72 U/L (ref 38–126)
ALT: 24 U/L (ref 17–63)
AST: 25 U/L (ref 15–41)
Albumin: 4 g/dL (ref 3.5–5.0)
BILIRUBIN TOTAL: 1 mg/dL (ref 0.3–1.2)
TOTAL PROTEIN: 7.5 g/dL (ref 6.5–8.1)

## 2016-11-28 LAB — BASIC METABOLIC PANEL
Anion gap: 6 (ref 5–15)
Anion gap: 5 (ref 5–15)
BUN: 37 mg/dL — AB (ref 6–20)
BUN: 31 mg/dL — AB (ref 6–20)
CHLORIDE: 96 mmol/L — AB (ref 101–111)
CO2: 23 mmol/L (ref 22–32)
CO2: 26 mmol/L (ref 22–32)
CREATININE: 1.3 mg/dL — AB (ref 0.61–1.24)
CREATININE: 1.23 mg/dL (ref 0.61–1.24)
Calcium: 9 mg/dL (ref 8.9–10.3)
Calcium: 8.9 mg/dL (ref 8.9–10.3)
Chloride: 102 mmol/L (ref 101–111)
GFR calc Af Amer: 60 mL/min — ABNORMAL LOW (ref >60)
GFR calc Af Amer: >60 mL/min (ref >60)
GFR calc non Af Amer: 52 mL/min — ABNORMAL LOW (ref >60)
GFR calc non Af Amer: 55 mL/min — ABNORMAL LOW (ref >60)
GLUCOSE: 160 mg/dL — AB (ref 65–99)
GLUCOSE: 149 mg/dL — AB (ref 65–99)
Potassium: 6.4 mmol/L (ref 3.5–5.1)
Potassium: 4.5 mmol/L (ref 3.5–5.1)
Sodium: 125 mmol/L — ABNORMAL LOW (ref 135–145)
Sodium: 133 mmol/L — ABNORMAL LOW (ref 135–145)

## 2016-11-28 LAB — CBC
HEMATOCRIT: 37.4 % — AB (ref 40.0–52.0)
Hemoglobin: 12.9 g/dL — ABNORMAL LOW (ref 13.0–18.0)
MCH: 30.5 pg (ref 26.0–34.0)
MCHC: 34.6 g/dL (ref 32.0–36.0)
MCV: 88.2 fL (ref 80.0–100.0)
PLATELETS: 297 10*3/uL (ref 150–440)
RBC: 4.24 MIL/uL — ABNORMAL LOW (ref 4.40–5.90)
RDW: 14.1 % (ref 11.5–14.5)
WBC: 12 10*3/uL — AB (ref 3.8–10.6)

## 2016-11-28 LAB — GLUCOSE, CAPILLARY
Glucose-Capillary: 116 mg/dL — ABNORMAL HIGH (ref 65–99)
Glucose-Capillary: 213 mg/dL — ABNORMAL HIGH (ref 65–99)
Glucose-Capillary: 180 mg/dL — ABNORMAL HIGH (ref 65–99)

## 2016-11-28 LAB — LIPASE, BLOOD: Lipase: 31 U/L (ref 11–51)

## 2016-11-28 LAB — TROPONIN I: Troponin I: <0.03 ng/mL (ref <0.03)

## 2016-11-28 MED ORDER — ACETAMINOPHEN 650 MG RE SUPP: 650.0000 mg | Freq: Four times a day (QID) | RECTAL | Status: DC | PRN

## 2016-11-28 MED ORDER — METOPROLOL TARTRATE 50 MG PO TABS
50.0000 mg | ORAL_TABLET | Freq: Two times a day (BID) | ORAL | Status: DC
  Administered 2016-11-28 – 2016-11-29 (×3): 50 mg via ORAL
  Filled 2016-11-28 (×3): qty 1

## 2016-11-28 MED ORDER — ACETAMINOPHEN 325 MG PO TABS
650.0000 mg | ORAL_TABLET | Freq: Once | ORAL | Status: AC
  Administered 2016-11-28: 650 mg via ORAL
  Filled 2016-11-28: qty 2

## 2016-11-28 MED ORDER — ASPIRIN EC 81 MG PO TBEC
81.0000 mg | DELAYED_RELEASE_TABLET | Freq: Every day | ORAL | Status: DC
  Administered 2016-11-28 – 2016-11-29 (×2): 81 mg via ORAL
  Filled 2016-11-28 (×2): qty 1

## 2016-11-28 MED ORDER — ONDANSETRON HCL 4 MG PO TABS: 4.0000 mg | ORAL_TABLET | Freq: Four times a day (QID) | ORAL | Status: DC | PRN

## 2016-11-28 MED ORDER — PRAVASTATIN SODIUM 40 MG PO TABS
40.0000 mg | ORAL_TABLET | Freq: Every day | ORAL | Status: DC
  Administered 2016-11-28: 18:00:00 40 mg via ORAL
  Filled 2016-11-28: qty 1

## 2016-11-28 MED ORDER — SODIUM CHLORIDE 0.9 % IV SOLN
INTRAVENOUS | Status: AC
  Administered 2016-11-28 – 2016-11-29 (×2): via INTRAVENOUS

## 2016-11-28 MED ORDER — DEXTROSE 50 % IV SOLN
1.0000 | Freq: Once | INTRAVENOUS | Status: AC
  Administered 2016-11-28: 50 mL via INTRAVENOUS
  Filled 2016-11-28: qty 50

## 2016-11-28 MED ORDER — GI COCKTAIL ~~LOC~~
30.0000 mL | Freq: Once | ORAL | Status: AC
  Administered 2016-11-28: 30 mL via ORAL

## 2016-11-28 MED ORDER — CALCIUM GLUCONATE 10 % IV SOLN
1.0000 g | Freq: Once | INTRAVENOUS | Status: AC
  Administered 2016-11-28: 1 g via INTRAVENOUS
  Filled 2016-11-28: qty 10

## 2016-11-28 MED ORDER — INSULIN ASPART 100 UNIT/ML ~~LOC~~ SOLN: 0.0000 [IU] | Freq: Three times a day (TID) | SUBCUTANEOUS | Status: DC

## 2016-11-28 MED ORDER — GI COCKTAIL ~~LOC~~
ORAL | Status: AC
  Administered 2016-11-28: 30 mL via ORAL
  Filled 2016-11-28: qty 30

## 2016-11-28 MED ORDER — INSULIN ASPART 100 UNIT/ML ~~LOC~~ SOLN: 0.0000 [IU] | Freq: Every day | SUBCUTANEOUS | Status: DC

## 2016-11-28 MED ORDER — SODIUM POLYSTYRENE SULFONATE 15 GM/60ML PO SUSP
30.0000 g | Freq: Once | ORAL | Status: AC
  Administered 2016-11-28: 30 g via ORAL
  Filled 2016-11-28: qty 120

## 2016-11-28 MED ORDER — MORPHINE SULFATE (PF) 2 MG/ML IV SOLN
2.0000 mg | Freq: Once | INTRAVENOUS | Status: AC | PRN
  Administered 2016-11-28: 2 mg via INTRAVENOUS
  Filled 2016-11-28: qty 1

## 2016-11-28 MED ORDER — PANTOPRAZOLE SODIUM 40 MG PO TBEC
40.0000 mg | DELAYED_RELEASE_TABLET | Freq: Every day | ORAL | Status: DC
Start: 2016-11-28 — End: 2016-11-29
  Administered 2016-11-28 – 2016-11-29 (×2): 40 mg via ORAL

## 2016-11-28 MED ORDER — OXYBUTYNIN CHLORIDE ER 5 MG PO TB24
5.0000 mg | ORAL_TABLET | Freq: Every day | ORAL | Status: DC
  Administered 2016-11-28: 21:00:00 5 mg via ORAL
  Filled 2016-11-28 (×2): qty 1

## 2016-11-28 MED ORDER — AMLODIPINE BESYLATE 10 MG PO TABS
10.0000 mg | ORAL_TABLET | Freq: Every day | ORAL | Status: DC
  Administered 2016-11-28 – 2016-11-29 (×2): 10 mg via ORAL
  Filled 2016-11-28 (×2): qty 1

## 2016-11-28 MED ORDER — ACETAMINOPHEN 325 MG PO TABS: 650.0000 mg | ORAL_TABLET | Freq: Four times a day (QID) | ORAL | Status: DC | PRN

## 2016-11-28 MED ORDER — GLIPIZIDE 10 MG PO TABS
10.0000 mg | ORAL_TABLET | Freq: Two times a day (BID) | ORAL | Status: DC
  Administered 2016-11-28 – 2016-11-29 (×2): 10 mg via ORAL
  Filled 2016-11-28 (×2): qty 1

## 2016-11-28 MED ORDER — ENOXAPARIN SODIUM 40 MG/0.4ML ~~LOC~~ SOLN
40.0000 mg | SUBCUTANEOUS | Status: DC
  Administered 2016-11-28: 21:00:00 40 mg via SUBCUTANEOUS
  Filled 2016-11-28: qty 0.4

## 2016-11-28 MED ORDER — MORPHINE SULFATE (PF) 2 MG/ML IV SOLN
2.0000 mg | INTRAVENOUS | Status: DC | PRN
  Administered 2016-11-28: 17:00:00 2 mg via INTRAVENOUS
  Filled 2016-11-28: qty 1

## 2016-11-28 MED ORDER — SODIUM BICARBONATE 8.4 % IV SOLN
50.0000 meq | Freq: Once | INTRAVENOUS | Status: AC
  Administered 2016-11-28: 50 meq via INTRAVENOUS
  Filled 2016-11-28: qty 50

## 2016-11-28 MED ORDER — ONDANSETRON HCL 4 MG/2ML IJ SOLN: 4.0000 mg | Freq: Four times a day (QID) | INTRAMUSCULAR | Status: DC | PRN

## 2016-11-28 MED ORDER — PANTOPRAZOLE SODIUM 40 MG PO TBEC
40.0000 mg | DELAYED_RELEASE_TABLET | Freq: Every day | ORAL | Status: DC
  Filled 2016-11-28 (×2): qty 1

## 2016-11-28 MED ORDER — INSULIN ASPART 100 UNIT/ML ~~LOC~~ SOLN
10.0000 [IU] | Freq: Once | SUBCUTANEOUS | Status: AC
  Administered 2016-11-28: 10 [IU] via INTRAVENOUS
  Filled 2016-11-28: qty 10

## 2016-11-28 MED ORDER — ONDANSETRON HCL 4 MG/2ML IJ SOLN
4.0000 mg | Freq: Once | INTRAMUSCULAR | Status: AC
  Administered 2016-11-28: 4 mg via INTRAVENOUS
  Filled 2016-11-28: qty 2

## 2016-11-28 MED ORDER — SODIUM CHLORIDE 0.9 % IV BOLUS (SEPSIS)
1000.0000 mL | Freq: Once | INTRAVENOUS | Status: AC
  Administered 2016-11-28: 1000 mL via INTRAVENOUS

## 2016-11-28 MED ORDER — TRAMADOL HCL 50 MG PO TABS: 50.0000 mg | ORAL_TABLET | Freq: Four times a day (QID) | ORAL | Status: DC | PRN

## 2016-11-28 NOTE — Care Management Obs Status (Signed)
MEDICARE OBSERVATION STATUS NOTIFICATION   Patient Details  Name: Kevin Kelley MRN: [id number] Date of Birth: 1940/09/08   Medicare Observation Status Notification Given:  Yes    [name], RN 11/28/2016, 2:20 PM

## 2016-11-28 NOTE — ED Provider Notes (Signed)
[facility] Emergency Department Provider Note ____________________________________________   I have reviewed the triage vital signs and the triage nursing note.  HISTORY  Chief Complaint Chest Pain   Historian Patient  HPI Kevin Kelley is a 76 y.o. male with dm, mi and chf presents with overnight nausea and vomiting, up to 5 times, nonbloody and nonbilious. Some epigastric discomfort without lower abdominal tenderness or pain. Denies diarrhea. Denies chest pain or trouble breathing. He states the nausea is pretty uncomfortable and persistent.  Symptoms are moderate and he feels a little sweaty. Nothing makes it worse or better.    Past Medical History:  Diagnosis Date  . CHF (congestive heart failure) (HCC)   . Coronary artery disease   . Diabetes mellitus without complication (HCC)   . MI (mitral incompetence)    three times    There are no active problems to display for this patient.   Past Surgical History:  Procedure Laterality Date  . CARDIAC CATHETERIZATION     twice  . EYE SURGERY    . HERNIA REPAIR      Prior to Admission medications   Medication Sig Start Date End Date Taking? Authorizing Provider  amLODipine (NORVASC) 10 MG tablet Take 10 mg by mouth daily.    [provider]  aspirin 81 MG tablet Take 81 mg by mouth daily.    [provider]  azithromycin (ZITHROMAX Z-PAK) 250 MG tablet Take 2 tablets (500 mg) on  Day 1,  followed by 1 tablet (250 mg) once daily on Days 2 through 5. 11/27/15   Sharman Cheek, MD  furosemide (LASIX) 20 MG tablet Take 20 mg by mouth.    [provider]  glipiZIDE (GLUCOTROL) 10 MG tablet Take 10 mg by mouth 2 (two) times daily before a meal.    [provider]  ibuprofen (ADVIL,MOTRIN) 200 MG tablet Take 200 mg by mouth every 6 (six) hours as needed (toke 800 mg at 0700 today 05/07/15).    [provider]  lisinopril (PRINIVIL,ZESTRIL) 20 MG tablet Take  20 mg by mouth daily.    [provider]  metoprolol (LOPRESSOR) 50 MG tablet Take 50 mg by mouth 2 (two) times daily.    [provider]  omeprazole (PRILOSEC) 20 MG capsule Take 20 mg by mouth daily.    [provider]  ondansetron (ZOFRAN ODT) 4 MG disintegrating tablet Take 1 tablet (4 mg total) by mouth every 8 (eight) hours as needed for nausea or vomiting. 11/27/15   Sharman Cheek, MD  oxybutynin (DITROPAN-XL) 5 MG 24 hr tablet Take 5 mg by mouth at bedtime.    [provider]  potassium chloride (K-DUR) 10 MEQ tablet Take 10 mEq by mouth daily.    [provider]  potassium chloride (K-DUR,KLOR-CON) 10 MEQ tablet Take 30 mEq by mouth once.    [provider]  pravastatin (PRAVACHOL) 40 MG tablet Take 40 mg by mouth daily.    [provider]  traMADol (ULTRAM) 50 MG tablet Take 1 tablet (50 mg total) by mouth every 6 (six) hours as needed. 05/07/15   Tommi Rumps, PA-C    No Known Allergies  No family history on file.  Social History Social History  Substance Use Topics  . Smoking status: Never Smoker  . Smokeless tobacco: Current User    Types: Chew  . Alcohol use No    Review of Systems  Constitutional: Negative for fever. Eyes: Negative for visual  changes. ENT: Negative for sore throat. Cardiovascular: Negative for chest pain. Respiratory: Negative for shortness of breath. Gastrointestinal: Positive for nausea and vomiting and epigastric pain without diarrhea. Genitourinary: Negative for dysuria. Musculoskeletal: Negative for back pain. Skin: Negative for rash. Neurological: Negative for headache.  ____________________________________________   PHYSICAL EXAM:  VITAL SIGNS: ED Triage Vitals  Enc Vitals Group     BP 11/28/16 1023 111/61     Pulse Rate 11/28/16 1023 68     Resp 11/28/16 1023 18     Temp 11/28/16 1023 98.5 F (36.9 C)     Temp Source 11/28/16 1023 Oral     SpO2 11/28/16  1023 97 %     Weight 11/28/16 1020 176 lb (79.8 kg)     Height 11/28/16 1020 5\' 7"  (1.702 m)     Head Circumference --      Peak Flow --      Pain Score 11/28/16 1020 10     Pain Loc --      Pain Edu? --      Excl. in GC? --      Constitutional: Alert and oriented. Acute distress, but likely doesn't feel very well. HEENT   Head: Normocephalic and atraumatic.      Eyes: Conjunctivae are normal. Pupils equal and round.       Ears:         Nose: No congestion/rhinnorhea.   Mouth/Throat: Mucous membranes are moist.   Neck: No stridor. Cardiovascular/Chest: Normal rate, regular rhythm.  No murmurs, rubs, or gallops. Respiratory: Normal respiratory effort without tachypnea nor retractions. Breath sounds are clear and equal bilaterally. No wheezes/rales/rhonchi. Gastrointestinal: Soft. No distention, no guarding, no rebound. Mild tenderness in the epigastrium and right upper quadrant.  Genitourinary/rectal:Deferred Musculoskeletal: Nontender with normal range of motion in all extremities. No joint effusions.  No lower extremity tenderness.  No edema. Neurologic:  Normal speech and language. No gross or focal neurologic deficits are appreciated. Skin:  Skin is warm, dry and intact. No rash noted. Psychiatric: Mood and affect are normal. Speech and behavior are normal. Patient exhibits appropriate insight and judgment.   ____________________________________________  LABS (pertinent positives/negatives)  Labs Reviewed  BASIC METABOLIC PANEL - Abnormal; Notable for the following:       Result Value   Sodium 125 (*)    Potassium 6.4 (*)    Chloride 96 (*)    Glucose, Bld 160 (*)    BUN 37 (*)    Creatinine, Ser 1.30 (*)    GFR calc non Af Amer 52 (*)    GFR calc Af Amer 60 (*)    All other components within normal limits  CBC - Abnormal; Notable for the following:    WBC 12.0 (*)    RBC 4.24 (*)    Hemoglobin 12.9 (*)    HCT 37.4 (*)    All other components within  normal limits  HEPATIC FUNCTION PANEL - Abnormal; Notable for the following:    Bilirubin, Direct <0.1 (*)    All other components within normal limits  TROPONIN I  LIPASE, BLOOD    ____________________________________________    EKG I, Governor Rooks, MD, the attending physician have personally viewed and interpreted all ECGs.  70 bpm normal sinus rhythm. Right bundle branch block. Normal axis.  T wave inverted inf and anteriorly.  ____________________________________________  RADIOLOGY All Xrays were viewed by me. Imaging interpreted by Radiologist.  Right upper quadrant ultrasound:  IMPRESSION: 1. No cholelithiasis or sonographic evidence of  acute cholecystitis.  Chest x-ray two-view:  IMPRESSION: No active cardiopulmonary disease.  Aortic atherosclerosis.  __________________________________________  PROCEDURES  Procedure(s) performed: None  Critical Care performed: None  ____________________________________________   ED COURSE / ASSESSMENT AND PLAN  Pertinent labs & imaging results that were available during my care of the patient were reviewed by me and considered in my medical decision making (see chart for details).    Mr. Corine ShelterWatkins is here feeling extremely fatigued after vomiting overnight. He was found to have significant acute hyponatremia at 125 was given initial first liter of fluid bolus with normal saline here in the emergency family. His potassium was also found to be elevated. I did go ahead and give him IV medications for this including calcium gluconate, insulin and glucose, as well as bicarbonate.  I discussed with the hospitalist for admission.  Around 1 PM, patient feels like nausea is under control and abdomen is nontender. However due to I disturbance, patient will be admitted for treatment and management.    CONSULTATIONS: Hospitalist for admission   Patient / Family / Caregiver informed of clinical course, medical decision-making process,  and agree with plan.  ___________________________________________   FINAL CLINICAL IMPRESSION(S) / ED DIAGNOSES   Final diagnoses:  RUQ pain  Hyponatremia  Hyperkalemia              Note: This dictation was prepared with Dragon dictation. Any transcriptional errors that result from this process are unintentional    Governor RooksLord, Sebron Mcmahill, MD 11/28/16 1308

## 2016-11-28 NOTE — ED Notes (Signed)
Patient transported to X-ray 

## 2016-11-28 NOTE — H&P (Signed)
[facility] [facility] [place] [facility]   PATIENT NAME: Kevin Kelley    MR#:  [id number]  DATE OF BIRTH:  Nov 14, 1940  DATE OF ADMISSION:  11/28/2016  PRIMARY CARE PHYSICIAN: [place] [place] [facility]   REQUESTING/REFERRING PHYSICIAN: Dr. Governor Rooks  CHIEF COMPLAINT:   Chief Complaint  Patient presents with  . Chest Pain    HISTORY OF PRESENT ILLNESS:  Kevin Kelley  is a 76 y.o. male with a known history of Coronary reactive disease, hypertension, diabetes mellitus, hyperlipidemia, congestive heart failure with unknown ejection fraction, hyperactive bladder presents to emergency room from PCP office due to intractable nausea, vomiting. Patient started with poor appetite about 3 days ago, but nausea vomiting didn't start until yesterday. It has been intractable and he was unable to keep anything down. Denies any diarrhea. No fevers or chills. Denies any recent travel or exposure to sick contacts. Denies any food poisoning. He also has been having minimal right-sided upper abdominal pain. Denies any chest pain, cold or congestion. Then he went to his PCP with his nausea and right upper quadrant pain there were also concerned for any cardiac issues and so sent to the emergency room. His labs were of normal here, first troponin was negative. He is being admitted for hyponatremia and hyperkalemia.  PAST MEDICAL HISTORY:   Past Medical History:  Diagnosis Date  . CHF (congestive heart failure) (HCC)   . Coronary artery disease   . Diabetes mellitus without complication (HCC)   . MI (mitral incompetence)    three times    PAST SURGICAL HISTORY:   Past Surgical History:  Procedure Laterality Date  . CARDIAC CATHETERIZATION     twice  . EYE SURGERY    . HERNIA REPAIR      SOCIAL HISTORY:   Social History  Substance Use Topics  . Smoking status: Never Smoker  . Smokeless tobacco: Current User    Types: Chew  . Alcohol use No    FAMILY HISTORY:    Family History  Problem Relation Age of Onset  . Hypertension Mother   . Hypertension Father     DRUG ALLERGIES:  No Known Allergies  REVIEW OF SYSTEMS:   Review of Systems  Constitutional: Positive for malaise/fatigue. Negative for chills, fever and weight loss.  HENT: Negative for ear discharge, ear pain, hearing loss, nosebleeds and tinnitus.   Eyes: Negative for blurred vision, double vision and photophobia.  Respiratory: Negative for cough, hemoptysis, shortness of breath and wheezing.   Cardiovascular: Negative for chest pain, palpitations, orthopnea and leg swelling.  Gastrointestinal: Positive for abdominal pain, nausea and vomiting. Negative for constipation, diarrhea, heartburn and melena.  Genitourinary: Negative for dysuria, frequency, hematuria and urgency.  Musculoskeletal: Negative for back pain, myalgias and neck pain.  Skin: Negative for rash.  Neurological: Negative for dizziness, tingling, tremors, sensory change, speech change, focal weakness and headaches.  Endo/Heme/Allergies: Does not bruise/bleed easily.  Psychiatric/Behavioral: Negative for depression.    MEDICATIONS AT HOME:   Prior to Admission medications   Medication Sig Start Date End Date Taking? Authorizing Provider  amLODipine (NORVASC) 10 MG tablet Take 10 mg by mouth daily.   Yes [provider]  aspirin 81 MG tablet Take 81 mg by mouth daily.    [provider]  azithromycin (ZITHROMAX Z-PAK) 250 MG tablet Take 2 tablets (500 mg) on  Day 1,  followed by 1 tablet (250 mg) once daily on Days 2 through 5. 11/27/15   Scotty Court,  Aneta Mins, MD  furosemide (LASIX) 20 MG tablet Take 20 mg by mouth.    [provider]  glipiZIDE (GLUCOTROL) 10 MG tablet Take 10 mg by mouth 2 (two) times daily before a meal.    [provider]  ibuprofen (ADVIL,MOTRIN) 200 MG tablet Take 200 mg by mouth every 6 (six) hours as needed (toke 800 mg at 0700 today 05/07/15).    [provider]  lisinopril (PRINIVIL,ZESTRIL) 20 MG tablet Take 20 mg by mouth daily.    [provider]  metoprolol (LOPRESSOR) 50 MG tablet Take 50 mg by mouth 2 (two) times daily.    [provider]  omeprazole (PRILOSEC) 20 MG capsule Take 20 mg by mouth daily.    [provider]  ondansetron (ZOFRAN ODT) 4 MG disintegrating tablet Take 1 tablet (4 mg total) by mouth every 8 (eight) hours as needed for nausea or vomiting. 11/27/15   Sharman Cheek, MD  oxybutynin (DITROPAN-XL) 5 MG 24 hr tablet Take 5 mg by mouth at bedtime.    [provider]  potassium chloride (K-DUR) 10 MEQ tablet Take 10 mEq by mouth daily.    [provider]  potassium chloride (K-DUR,KLOR-CON) 10 MEQ tablet Take 30 mEq by mouth once.    [provider]  pravastatin (PRAVACHOL) 40 MG tablet Take 40 mg by mouth daily.    [provider]  traMADol (ULTRAM) 50 MG tablet Take 1 tablet (50 mg total) by mouth every 6 (six) hours as needed. 05/07/15   Tommi Rumps, PA-C      VITAL SIGNS:  Blood pressure (!) 148/68, pulse 69, temperature 98.2 F (36.8 C), temperature source Oral, resp. rate 18, height 5\' 7"  (1.702 m), weight 78.2 kg (172 lb 8 oz), SpO2 100 %.  PHYSICAL EXAMINATION:   Physical Exam  GENERAL:  76 y.o.-year-old patient lying in the bed with no acute distress.  EYES: Pupils equal, round, reactive to light and accommodation. No scleral icterus. Extraocular muscles intact.  HEENT: Head atraumatic, normocephalic. Oropharynx and nasopharynx clear.  NECK:  Supple, no jugular venous distention. No thyroid enlargement, no tenderness.  LUNGS: Normal breath sounds bilaterally, no wheezing, rales,rhonchi or crepitation. No use of accessory muscles of respiration.  CARDIOVASCULAR: S1, S2 normal. No murmurs, rubs, or gallops.  ABDOMEN: Soft, minimal right upper quadrant tenderness, nondistended. Bowel sounds present. No organomegaly or mass.    EXTREMITIES: No pedal edema, cyanosis, or clubbing.  NEUROLOGIC: Cranial nerves II through XII are intact. Muscle strength 5/5 in all extremities. Sensation intact. Gait not checked.  PSYCHIATRIC: The patient is alert and oriented x 3.  SKIN: No obvious rash, lesion, or ulcer.   LABORATORY PANEL:   CBC  Recent Labs Lab 11/28/16 1022  WBC 12.0*  HGB 12.9*  HCT 37.4*  PLT 297   ------------------------------------------------------------------------------------------------------------------  Chemistries   Recent Labs Lab 11/28/16 1022  NA 125*  K 6.4*  CL 96*  CO2 23  GLUCOSE 160*  BUN 37*  CREATININE 1.30*  CALCIUM 9.0  AST 25  ALT 24  ALKPHOS 72  BILITOT 1.0   ------------------------------------------------------------------------------------------------------------------  Cardiac Enzymes  Recent Labs Lab 11/28/16 1022  TROPONINI <0.03   ------------------------------------------------------------------------------------------------------------------  RADIOLOGY:  Dg Chest 2 View  Result Date: 11/28/2016 CLINICAL DATA:  Chest pain. EXAM: CHEST  2 VIEW COMPARISON:  Radiographs of Nov 02, 2016. FINDINGS: The heart size and mediastinal contours are within normal limits. Both lungs are clear. No pneumothorax or pleural effusion is noted. Atherosclerosis  of thoracic aorta is noted. The visualized skeletal structures are unremarkable. IMPRESSION: No active cardiopulmonary disease.  Aortic atherosclerosis. Electronically Signed   By: Lupita RaiderJames  Green Jr, M.D.   On: 11/28/2016 10:48   [place] Abdomen Limited Ruq  Result Date: 11/28/2016 CLINICAL DATA:  Right upper quadrant pain for 3 days EXAM: ULTRASOUND ABDOMEN LIMITED RIGHT UPPER QUADRANT COMPARISON:  None. FINDINGS: Gallbladder: No gallstones or wall thickening visualized. No sonographic Murphy sign noted by sonographer. Common bile duct: Diameter: 4.2 mm Liver: No focal lesion identified. Within normal limits in  parenchymal echogenicity. IMPRESSION: 1. No cholelithiasis or sonographic evidence of acute cholecystitis. Electronically Signed   By: Elige KoHetal  Patel   On: 11/28/2016 12:11    EKG:   Orders placed or performed during the hospital encounter of 11/28/16  . ED EKG within 10 minutes  . ED EKG within 10 minutes  . EKG 12-Lead  . EKG 12-Lead    IMPRESSION AND PLAN:   Kevin Kelley  is a 76 y.o. male with a known history of Coronary reactive disease, hypertension, diabetes mellitus, hyperlipidemia, congestive heart failure with unknown ejection fraction, hyperactive bladder presents to emergency room from PCP office due to intractable nausea, vomiting.  #1 intractable nausea/vomiting-acute gastroenteritis. Symptoms are much improved now. -Ultrasound of right upper quadrant with no cholecystitis or gallstones. -Symptomatic management and monitor carefully.  #2 hyponatremia and hyperkalemia-due to dehydration. Also was on potassium supplements at home. -Metastases given for hyperkalemia treatment and recheck this afternoon. No EKG changes. -Started on IV fluids. Recheck labs later today -Hold lisinopril and Lasix.  #3 hypertension-on metoprolol and Norvasc. Lasix and lisinopril are on hold.  #4 diabetes mellitus-on glipizide. Check A1c. Also on sliding scale insulin.  #5 DVT prophylaxis-Lovenox    All the records are reviewed and case discussed with ED provider. Management plans discussed with the patient, family and they are in agreement.  CODE STATUS: Full Code  TOTAL TIME TAKING CARE OF THIS PATIENT: 50 minutes.    Enid BaasKALISETTI,Sarissa Dern M.D on 11/28/2016 at 5:09 PM  Between 7am to 6pm - Pager - [phone]  After 6pm go to [email or web address] - Social research officer, governmentpassword [email or web address] [email or web address]  [facility]  Office  [phone]  CC: Primary care physician; [place]

## 2016-11-28 NOTE — ED Notes (Signed)
Patient transported to ultrasound.

## 2016-11-28 NOTE — ED Triage Notes (Signed)
Pt comes into the ED via ems from home with c/o generalized stabbing chest pain and SOB for the past 2 days.

## 2016-11-28 NOTE — Progress Notes (Signed)
High fall risk. Patient refuses bed alarm. Room close to nurses station. Room clean, call bell and phone within reach. Yellow non-skid socks on. Patient verbalizes understanding of calling staff for assistance out of bed.

## 2016-11-28 NOTE — ED Notes (Signed)
Pt sleeping. 

## 2016-11-29 DIAGNOSIS — E860 Dehydration: Secondary | ICD-10-CM

## 2016-11-29 DIAGNOSIS — N289 Disorder of kidney and ureter, unspecified: Secondary | ICD-10-CM

## 2016-11-29 DIAGNOSIS — E871 Hypo-osmolality and hyponatremia: Secondary | ICD-10-CM

## 2016-11-29 DIAGNOSIS — R112 Nausea with vomiting, unspecified: Secondary | ICD-10-CM

## 2016-11-29 DIAGNOSIS — R1011 Right upper quadrant pain: Secondary | ICD-10-CM

## 2016-11-29 LAB — BASIC METABOLIC PANEL
Anion gap: 6 (ref 5–15)
BUN: 24 mg/dL — ABNORMAL HIGH (ref 6–20)
CALCIUM: 8.4 mg/dL — AB (ref 8.9–10.3)
CO2: 24 mmol/L (ref 22–32)
CREATININE: 1.15 mg/dL (ref 0.61–1.24)
Chloride: 104 mmol/L (ref 101–111)
GFR calc non Af Amer: 60 mL/min — ABNORMAL LOW (ref >60)
Glucose, Bld: 117 mg/dL — ABNORMAL HIGH (ref 65–99)
Potassium: 4.7 mmol/L (ref 3.5–5.1)
SODIUM: 134 mmol/L — AB (ref 135–145)

## 2016-11-29 LAB — HEMOGLOBIN A1C
HEMOGLOBIN A1C: 6.3 % — AB (ref 4.8–5.6)
Mean Plasma Glucose: 134 mg/dL

## 2016-11-29 LAB — URINALYSIS, COMPLETE (UACMP) WITH MICROSCOPIC
BACTERIA UA: NONE SEEN
BILIRUBIN URINE: NEGATIVE
Glucose, UA: >=500 mg/dL — AB
Hgb urine dipstick: NEGATIVE
Ketones, ur: NEGATIVE mg/dL
Leukocytes, UA: NEGATIVE
NITRITE: NEGATIVE
PH: 6 (ref 5.0–8.0)
Protein, ur: 100 mg/dL — AB
SPECIFIC GRAVITY, URINE: 1.009 (ref 1.005–1.030)
SQUAMOUS EPITHELIAL / LPF: NONE SEEN

## 2016-11-29 LAB — TROPONIN I

## 2016-11-29 LAB — CBC
HCT: 33.3 % — ABNORMAL LOW (ref 40.0–52.0)
Hemoglobin: 12 g/dL — ABNORMAL LOW (ref 13.0–18.0)
MCH: 31.8 pg (ref 26.0–34.0)
MCHC: 36 g/dL (ref 32.0–36.0)
MCV: 88.5 fL (ref 80.0–100.0)
PLATELETS: 236 10*3/uL (ref 150–440)
RBC: 3.76 MIL/uL — AB (ref 4.40–5.90)
RDW: 13.8 % (ref 11.5–14.5)
WBC: 9.3 10*3/uL (ref 3.8–10.6)

## 2016-11-29 LAB — GLUCOSE, CAPILLARY: Glucose-Capillary: 134 mg/dL — ABNORMAL HIGH (ref 65–99)

## 2016-11-29 MED ORDER — ONDANSETRON HCL 4 MG PO TABS: 4.0000 mg | ORAL_TABLET | Freq: Four times a day (QID) | ORAL | 0 refills | Status: AC | PRN

## 2016-11-29 MED ORDER — PANTOPRAZOLE SODIUM 40 MG PO TBEC: 40.0000 mg | DELAYED_RELEASE_TABLET | Freq: Every day | ORAL | 3 refills | Status: AC

## 2016-11-29 NOTE — Discharge Instructions (Signed)
Please hold Lasix and potassium supplementation until oral intake is completely back to normal, thank you

## 2016-11-29 NOTE — Progress Notes (Signed)
Pt is being discharged today. IV x1 removed. All belongings returned to the patient. Discharge instructions reviewed with the patient with the patient. He verified understanding. Pt refused a wheelchair, but he was escorted to the visitors entrance by staff.

## 2016-11-29 NOTE — Discharge Summary (Signed)
[facility] [place] at [facility]   PATIENT NAME: Kevin Kelley    MR#:  [id number]  DATE OF BIRTH:  06-05-1941  DATE OF ADMISSION:  11/28/2016 ADMITTING PHYSICIAN: Enid Baas, MD  DATE OF DISCHARGE: No discharge date for patient encounter.  PRIMARY CARE PHYSICIAN: [place], [place] [facility]     ADMISSION DIAGNOSIS:  Hyperkalemia [E87.5] Hyponatremia [E87.1] RUQ pain [R10.11]  DISCHARGE DIAGNOSIS:  Active Problems:   Hyperkalemia   Acute renal insufficiency   Intractable nausea and vomiting   Dehydration   Right upper quadrant abdominal pain   Hyponatremia   SECONDARY DIAGNOSIS:   Past Medical History:  Diagnosis Date  . CHF (congestive heart failure) (HCC)   . Coronary artery disease   . Diabetes mellitus without complication (HCC)   . Hypertension   . MI (mitral incompetence)    three times    .pro HOSPITAL COURSE:   The patient is 76 year old Caucasian male with past medical history significant for history of coronary artery disease, CHF, diabetes, hypertension, who presents to the hospital with complaints of right upper quadrant abdominal pain, nausea, vomiting for 3 days. He denied any other symptoms. He was brought to emergency room due to hyponatremia and hyperkalemia. His labs also revealed mild renal insufficiency with creatinine 1.3, elevated BNP and 37, elevated white blood cell count to 12.0, mild anemia, hyperglycemia, hemoglobin A1c was 6.3. Urinalysis was unremarkable, except for hyperglycemia. Patient was admitted to the hospital, rehydrated, his diet was advanced. He was able to tolerate diet and was felt to be stable to be discharged home. He was recommended to continue Protonix and Zofran as needed. Discussion by problem: #1. Intractable nausea and vomiting, etiology is unclear, however, suspected acute gastritis, patient is to continue Protonix and Zofran as needed, his diet was advanced and he was able to  tolerate this well #2 dehydration, resolved with IV fluid administration #3. Acute renal insufficiency, resolved, urinalysis was unremarkable, no obvious UTI #4. Hyperkalemia due to dehydration, acute renal insufficiency, resolved with conservative therapy #5. Hyponatremia, resolved #6. Right upper quadrant abdominal pain, ultrasound of abdomen revealed no cholelithiasis or cholecystitis DISCHARGE CONDITIONS:  Stable  CONSULTS OBTAINED:    DRUG ALLERGIES:  No Known Allergies  DISCHARGE MEDICATIONS:   Current Discharge Medication List    START taking these medications   Details  ondansetron (ZOFRAN) 4 MG tablet Take 1 tablet (4 mg total) by mouth every 6 (six) hours as needed for nausea. Qty: 20 tablet, Refills: 0    pantoprazole (PROTONIX) 40 MG tablet Take 1 tablet (40 mg total) by mouth daily. Qty: 30 tablet, Refills: 3      CONTINUE these medications which have NOT CHANGED   Details  amLODipine (NORVASC) 10 MG tablet Take 10 mg by mouth daily.    aspirin 81 MG tablet Take 81 mg by mouth daily.    azithromycin (ZITHROMAX Z-PAK) 250 MG tablet Take 2 tablets (500 mg) on  Day 1,  followed by 1 tablet (250 mg) once daily on Days 2 through 5. Qty: 6 each, Refills: 0    glipiZIDE (GLUCOTROL) 10 MG tablet Take 10 mg by mouth 2 (two) times daily before a meal.    lisinopril (PRINIVIL,ZESTRIL) 20 MG tablet Take 20 mg by mouth daily.    metoprolol (LOPRESSOR) 50 MG tablet Take 50 mg by mouth 2 (two) times daily.    oxybutynin (DITROPAN-XL) 5 MG 24 hr tablet Take 5 mg by mouth at bedtime.  pravastatin (PRAVACHOL) 40 MG tablet Take 40 mg by mouth daily.    traMADol (ULTRAM) 50 MG tablet Take 1 tablet (50 mg total) by mouth every 6 (six) hours as needed. Qty: 20 tablet, Refills: 0      STOP taking these medications     furosemide (LASIX) 20 MG tablet      ibuprofen (ADVIL,MOTRIN) 200 MG tablet      omeprazole (PRILOSEC) 20 MG capsule      ondansetron (ZOFRAN ODT) 4  MG disintegrating tablet      potassium chloride (K-DUR) 10 MEQ tablet      potassium chloride (K-DUR,KLOR-CON) 10 MEQ tablet          DISCHARGE INSTRUCTIONS:    The patient is to follow-up with primary care physician within one week after discharge  If you experience worsening of your admission symptoms, develop shortness of breath, life threatening emergency, suicidal or homicidal thoughts you must seek medical attention immediately by calling 911 or calling your MD immediately  if symptoms less severe.  You Must read complete instructions/literature along with all the possible adverse reactions/side effects for all the Medicines you take and that have been prescribed to you. Take any new Medicines after you have completely understood and accept all the possible adverse reactions/side effects.   Please note  You were cared for by a hospitalist during your hospital stay. If you have any questions about your discharge medications or the care you received while you were in the hospital after you are discharged, you can call the unit and asked to speak with the hospitalist on call if the hospitalist that took care of you is not available. Once you are discharged, your primary care physician will handle any further medical issues. Please note that NO REFILLS for any discharge medications will be authorized once you are discharged, as it is imperative that you return to your primary care physician (or establish a relationship with a primary care physician if you do not have one) for your aftercare needs so that they can reassess your need for medications and monitor your lab values.    Today   CHIEF COMPLAINT:   Chief Complaint  Patient presents with  . Chest Pain    HISTORY OF PRESENT ILLNESS:     VITAL SIGNS:  Blood pressure 137/70, pulse 65, temperature 98.7 F (37.1 C), temperature source Oral, resp. rate 19, height 5\' 7"  (1.702 m), weight 78.2 kg (172 lb 8 oz), SpO2 97  %.  I/O:   Intake/Output Summary (Last 24 hours) at 11/29/16 1159 Last data filed at 11/29/16 0938  Gross per 24 hour  Intake             1675 ml  Output             1125 ml  Net              550 ml    PHYSICAL EXAMINATION:  GENERAL:  76 y.o.-year-old patient lying in the bed with no acute distress.  EYES: Pupils equal, round, reactive to light and accommodation. No scleral icterus. Extraocular muscles intact.  HEENT: Head atraumatic, normocephalic. Oropharynx and nasopharynx clear.  NECK:  Supple, no jugular venous distention. No thyroid enlargement, no tenderness.  LUNGS: Normal breath sounds bilaterally, no wheezing, rales,rhonchi or crepitation. No use of accessory muscles of respiration.  CARDIOVASCULAR: S1, S2 normal. No murmurs, rubs, or gallops.  ABDOMEN: Soft, non-tender, non-distended. Bowel sounds present. No organomegaly or mass.  EXTREMITIES: No  pedal edema, cyanosis, or clubbing.  NEUROLOGIC: Cranial nerves II through XII are intact. Muscle strength 5/5 in all extremities. Sensation intact. Gait not checked.  PSYCHIATRIC: The patient is alert and oriented x 3.  SKIN: No obvious rash, lesion, or ulcer.   DATA REVIEW:   CBC  Recent Labs Lab 11/29/16 0412  WBC 9.3  HGB 12.0*  HCT 33.3*  PLT 236    Chemistries   Recent Labs Lab 11/28/16 1022  11/29/16 0412  NA 125*  < > 134*  K 6.4*  < > 4.7  CL 96*  < > 104  CO2 23  < > 24  GLUCOSE 160*  < > 117*  BUN 37*  < > 24*  CREATININE 1.30*  < > 1.15  CALCIUM 9.0  < > 8.4*  AST 25  --   --   ALT 24  --   --   ALKPHOS 72  --   --   BILITOT 1.0  --   --   < > = values in this interval not displayed.  Cardiac Enzymes  Recent Labs Lab 11/29/16 0412  TROPONINI <0.03    Microbiology Results  No results found for this or any previous visit.  RADIOLOGY:  Dg Chest 2 View  Result Date: 11/28/2016 CLINICAL DATA:  Chest pain. EXAM: CHEST  2 VIEW COMPARISON:  Radiographs of Nov 02, 2016. FINDINGS: The  heart size and mediastinal contours are within normal limits. Both lungs are clear. No pneumothorax or pleural effusion is noted. Atherosclerosis of thoracic aorta is noted. The visualized skeletal structures are unremarkable. IMPRESSION: No active cardiopulmonary disease.  Aortic atherosclerosis. Electronically Signed   By: Lupita RaiderJames  Green Jr, M.D.   On: 11/28/2016 10:48   [place] Abdomen Limited Ruq  Result Date: 11/28/2016 CLINICAL DATA:  Right upper quadrant pain for 3 days EXAM: ULTRASOUND ABDOMEN LIMITED RIGHT UPPER QUADRANT COMPARISON:  None. FINDINGS: Gallbladder: No gallstones or wall thickening visualized. No sonographic Murphy sign noted by sonographer. Common bile duct: Diameter: 4.2 mm Liver: No focal lesion identified. Within normal limits in parenchymal echogenicity. IMPRESSION: 1. No cholelithiasis or sonographic evidence of acute cholecystitis. Electronically Signed   By: Elige KoHetal  Patel   On: 11/28/2016 12:11    EKG:   Orders placed or performed during the hospital encounter of 11/28/16  . ED EKG within 10 minutes  . ED EKG within 10 minutes  . EKG 12-Lead  . EKG 12-Lead      Management plans discussed with the patient, family and they are in agreement.  CODE STATUS:     Code Status Orders        Start     Ordered   11/28/16 1451  Full code  Continuous     11/28/16 1450    Code Status History    Date Active Date Inactive Code Status Order ID Comments User Context   This patient has a current code status but no historical code status.      TOTAL TIME TAKING CARE OF THIS PATIENT: 40 minutes.    Katharina CaperVAICKUTE,Khrystina Bonnes M.D on 11/29/2016 at 11:59 AM  Between 7am to 6pm - Pager - [phone]  After 6pm go to [email or web address] - password [email or web address] [facility]  [facility]  Office  [phone]  CC: Primary care physician; [place]

## 2017-03-18 ENCOUNTER — Ambulatory Visit: Payer: Medicare PPO | Admitting: Anesthesiology

## 2017-03-18 ENCOUNTER — Ambulatory Visit
Admission: RE | Admit: 2017-03-18 | Discharge: 2017-03-18 | Disposition: A | Discharge status: Home / Self Care | Payer: Medicare PPO | Source: Ambulatory Visit | Attending: Unknown Physician Specialty | Admitting: Unknown Physician Specialty

## 2017-03-18 ENCOUNTER — Encounter
Admission: RE | Disposition: A | Discharge status: Home / Self Care | Payer: Self-pay | Source: Ambulatory Visit | Attending: Unknown Physician Specialty

## 2017-03-18 DIAGNOSIS — Z7982 Long term (current) use of aspirin: Secondary | ICD-10-CM | POA: Insufficient documentation

## 2017-03-18 DIAGNOSIS — K2970 Gastritis, unspecified, without bleeding: Secondary | ICD-10-CM | POA: Diagnosis not present

## 2017-03-18 DIAGNOSIS — Z791 Long term (current) use of non-steroidal anti-inflammatories (NSAID): Secondary | ICD-10-CM | POA: Insufficient documentation

## 2017-03-18 DIAGNOSIS — I110 Hypertensive heart disease with heart failure: Secondary | ICD-10-CM | POA: Insufficient documentation

## 2017-03-18 DIAGNOSIS — K2950 Unspecified chronic gastritis without bleeding: Secondary | ICD-10-CM | POA: Diagnosis not present

## 2017-03-18 DIAGNOSIS — Z7984 Long term (current) use of oral hypoglycemic drugs: Secondary | ICD-10-CM | POA: Diagnosis not present

## 2017-03-18 DIAGNOSIS — Z888 Allergy status to other drugs, medicaments and biological substances status: Secondary | ICD-10-CM | POA: Diagnosis not present

## 2017-03-18 DIAGNOSIS — R1310 Dysphagia, unspecified: Secondary | ICD-10-CM | POA: Diagnosis not present

## 2017-03-18 DIAGNOSIS — I2510 Atherosclerotic heart disease of native coronary artery without angina pectoris: Secondary | ICD-10-CM | POA: Diagnosis not present

## 2017-03-18 DIAGNOSIS — E119 Type 2 diabetes mellitus without complications: Secondary | ICD-10-CM | POA: Diagnosis not present

## 2017-03-18 DIAGNOSIS — I509 Heart failure, unspecified: Secondary | ICD-10-CM | POA: Diagnosis not present

## 2017-03-18 DIAGNOSIS — Z79899 Other long term (current) drug therapy: Secondary | ICD-10-CM | POA: Diagnosis not present

## 2017-03-18 HISTORY — PX: ESOPHAGOGASTRODUODENOSCOPY (EGD) WITH PROPOFOL: SHX5813

## 2017-03-18 LAB — GLUCOSE, CAPILLARY: GLUCOSE-CAPILLARY: 168 mg/dL — AB (ref 65–99)

## 2017-03-18 SURGERY — ESOPHAGOGASTRODUODENOSCOPY (EGD) WITH PROPOFOL
Anesthesia: General

## 2017-03-18 MED ORDER — PROPOFOL 10 MG/ML IV BOLUS
INTRAVENOUS | Status: DC | PRN
  Administered 2017-03-18: 80 mg via INTRAVENOUS

## 2017-03-18 MED ORDER — FENTANYL CITRATE (PF) 100 MCG/2ML IJ SOLN
INTRAMUSCULAR | Status: AC
  Filled 2017-03-18: qty 2

## 2017-03-18 MED ORDER — EPHEDRINE SULFATE 50 MG/ML IJ SOLN: INTRAMUSCULAR | Status: DC | PRN

## 2017-03-18 MED ORDER — FENTANYL CITRATE (PF) 100 MCG/2ML IJ SOLN
INTRAMUSCULAR | Status: DC | PRN
  Administered 2017-03-18 (×2): 50 ug via INTRAVENOUS

## 2017-03-18 MED ORDER — LIDOCAINE 2% (20 MG/ML) 5 ML SYRINGE
INTRAMUSCULAR | Status: DC | PRN
  Administered 2017-03-18: 30 mg via INTRAVENOUS

## 2017-03-18 MED ORDER — SODIUM CHLORIDE 0.9 % IV SOLN: INTRAVENOUS | Status: DC

## 2017-03-18 MED ORDER — PROPOFOL 500 MG/50ML IV EMUL
INTRAVENOUS | Status: DC | PRN
  Administered 2017-03-18: 140 ug/kg/min via INTRAVENOUS

## 2017-03-18 MED ORDER — LIDOCAINE HCL (PF) 1 % IJ SOLN: 2.0000 mL | Freq: Once | INTRAMUSCULAR | Status: DC

## 2017-03-18 MED ORDER — SODIUM CHLORIDE 0.9 % IV SOLN
INTRAVENOUS | Status: DC
  Administered 2017-03-18 (×2): via INTRAVENOUS

## 2017-03-18 NOTE — Transfer of Care (Signed)
Immediate Anesthesia Transfer of Care Note  Patient: Kevin Kelley  Procedure(s) Performed: ESOPHAGOGASTRODUODENOSCOPY (EGD) WITH PROPOFOL (N/A )  Patient Location: PACU and Endoscopy Unit  Anesthesia Type:General  Level of Consciousness: awake, drowsy and patient cooperative  Airway & Oxygen Therapy: Patient Spontanous Breathing and Patient connected to nasal cannula oxygen  Post-op Assessment: Report given to RN  Post vital signs: Reviewed and stable  Last Vitals:  Vitals:   03/18/17 1259  BP: 122/62  Pulse: (!) 52  Resp: 17  Temp: (!) 35.6 C  SpO2: 100%    Last Pain:  Vitals:   03/18/17 1259  TempSrc: Tympanic         Complications: No apparent anesthesia complications

## 2017-03-18 NOTE — H&P (Signed)
Primary Care Physician:  [facility] [place], [place] [facility] Primary Gastroenterologist:  Dr. Mechele Collin  Pre-Procedure History & Physical: HPI:  Kevin Kelley is a 76 y.o. male is here for an endoscopy.   Past Medical History:  Diagnosis Date  . CHF (congestive heart failure) (HCC)   . Coronary artery disease   . Diabetes mellitus without complication (HCC)   . Hypertension   . MI (mitral incompetence)    three times    Past Surgical History:  Procedure Laterality Date  . CARDIAC CATHETERIZATION     twice  . EYE SURGERY    . HERNIA REPAIR      Prior to Admission medications   Medication Sig Start Date End Date Taking? Authorizing Provider  atorvastatin (LIPITOR) 40 MG tablet Take 40 mg by mouth daily.   Yes [provider]  furosemide (LASIX) 20 MG tablet Take 20 mg by mouth daily.   Yes [provider]  isosorbide mononitrate (IMDUR) 30 MG 24 hr tablet Take 30 mg by mouth daily.   Yes [provider]  metFORMIN (GLUCOPHAGE) 1000 MG tablet Take 1,000 mg by mouth 2 (two) times daily with a meal.   Yes [provider]  omeprazole (PRILOSEC) 20 MG capsule Take 20 mg by mouth daily.   Yes [provider]  tamsulosin (FLOMAX) 0.4 MG CAPS capsule Take 0.4 mg by mouth.   Yes [provider]  amLODipine (NORVASC) 10 MG tablet Take 10 mg by mouth daily.    [provider]  aspirin 81 MG tablet Take 81 mg by mouth daily.    [provider]  azithromycin (ZITHROMAX Z-PAK) 250 MG tablet Take 2 tablets (500 mg) on  Day 1,  followed by 1 tablet (250 mg) once daily on Days 2 through 5. Patient not taking: Reported on 03/18/2017 11/27/15   Sharman Cheek, MD  glipiZIDE (GLUCOTROL) 10 MG tablet Take 10 mg by mouth 2 (two) times daily before a meal.    [provider]  ibuprofen (ADVIL,MOTRIN) 200 MG tablet Take 200 mg by mouth.    [provider]  lisinopril (PRINIVIL,ZESTRIL) 20 MG tablet Take 20 mg by  mouth daily.    [provider]  metoprolol (LOPRESSOR) 50 MG tablet Take 50 mg by mouth 2 (two) times daily.    [provider]  ondansetron (ZOFRAN) 4 MG tablet Take 1 tablet (4 mg total) by mouth every 6 (six) hours as needed for nausea. 11/29/16   Katharina Caper, MD  oxybutynin (DITROPAN-XL) 5 MG 24 hr tablet Take 5 mg by mouth at bedtime.    [provider]  pantoprazole (PROTONIX) 40 MG tablet Take 1 tablet (40 mg total) by mouth daily. 11/29/16   Katharina Caper, MD  pravastatin (PRAVACHOL) 40 MG tablet Take 40 mg by mouth daily.    [provider]  traMADol (ULTRAM) 50 MG tablet Take 1 tablet (50 mg total) by mouth every 6 (six) hours as needed. 05/07/15   Tommi Rumps, PA-C    Allergies as of 03/17/2017 - Review Complete 03/17/2017  Allergen Reaction Noted  . Codeine  03/17/2017    Family History  Problem Relation Age of Onset  . Hypertension Mother   . Stroke Mother   . Hypertension Father   . Stroke Father   . Cancer Sister   . Diabetes Mellitus II Sister   . Alcohol abuse Brother   . Cancer Brother   . CAD Brother   . Diabetes Mellitus  II Sister     Social History   Social History  . Marital status: Married    Spouse name: N/A  . Number of children: N/A  . Years of education: N/A   Occupational History  . Not on file.   Social History Main Topics  . Smoking status: Never Smoker  . Smokeless tobacco: Current User    Types: Chew  . Alcohol use No  . Drug use: No  . Sexual activity: No   Other Topics Concern  . Not on file   Social History Narrative  . No narrative on file    Review of Systems: See HPI, otherwise negative ROS  Physical Exam: BP 122/62   Pulse (!) 52   Temp (!) 96 F (35.6 C) (Tympanic)   Resp 17   Ht  (1.702 m)   Wt 79.8 kg (176 lb)   SpO2 100%   BMI 27.57 kg/m  General:   Alert,  pleasant and cooperative in NAD Head:  Normocephalic and atraumatic. Neck:  Supple; no masses or  thyromegaly. Lungs:  Clear throughout to auscultation.    Heart:  Regular rate and rhythm. Abdomen:  Soft, nontender and nondistended. Normal bowel sounds, without guarding, and without rebound.   Neurologic:  Alert and  oriented x4;  grossly normal neurologically.  Impression/Plan: Kevin Kelley is here for an endoscopy to be performed for dysphagia.  Risks, benefits, limitations, and alternatives regarding  endoscopy have been reviewed with the patient.  Questions have been answered.  All parties agreeable.   Lynnae Prude, MD  03/18/2017, 2:03 PM

## 2017-03-18 NOTE — Anesthesia Postprocedure Evaluation (Signed)
Anesthesia Post Note  Patient: Kevin Kelley  Procedure(s) Performed: ESOPHAGOGASTRODUODENOSCOPY (EGD) WITH PROPOFOL (N/A )  Patient location during evaluation: Endoscopy Anesthesia Type: General Level of consciousness: awake and alert Pain management: pain level controlled Vital Signs Assessment: post-procedure vital signs reviewed and stable Respiratory status: spontaneous breathing and respiratory function stable Cardiovascular status: stable Anesthetic complications: no     Last Vitals:  Vitals:   03/18/17 1259 03/18/17 1429  BP: 122/62 116/72  Pulse: (!) 52 (!) 54  Resp: 17 14  Temp: (!) 35.6 C (!) 36.1 C  SpO2: 100% 98%    Last Pain:  Vitals:   03/18/17 1429  TempSrc: Tympanic                 KEPHART,WILLIAM K

## 2017-03-18 NOTE — Anesthesia Preprocedure Evaluation (Signed)
Anesthesia Evaluation  Patient identified by MRN, date of birth, ID band Patient awake    Reviewed: Allergy & Precautions, NPO status , Patient's Chart, lab work & pertinent test results  History of Anesthesia Complications Negative for: history of anesthetic complications  Airway Mallampati: III       Dental  (+) Edentulous Upper, Edentulous Lower   Pulmonary neg sleep apnea, neg COPD,           Cardiovascular hypertension, Pt. on medications and Pt. on home beta blockers + CAD and +CHF (hx)  (-) Past MI and (-) Orthopnea (-) dysrhythmias (-) Valvular Problems/Murmurs     Neuro/Psych neg Seizures    GI/Hepatic Neg liver ROS, GERD  Medicated and Controlled,  Endo/Other  diabetes, Type 2, Oral Hypoglycemic Agents  Renal/GU negative Renal ROS     Musculoskeletal   Abdominal   Peds  Hematology   Anesthesia Other Findings   Reproductive/Obstetrics                             Anesthesia Physical Anesthesia Plan  ASA: III  Anesthesia Plan: General   Post-op Pain Management:    Induction: Intravenous  PONV Risk Score and Plan: Propofol infusion  Airway Management Planned:   Additional Equipment:   Intra-op Plan:   Post-operative Plan:   Informed Consent: I have reviewed the patients History and Physical, chart, labs and discussed the procedure including the risks, benefits and alternatives for the proposed anesthesia with the patient or authorized representative who has indicated his/her understanding and acceptance.     Plan Discussed with:   Anesthesia Plan Comments:         Anesthesia Quick Evaluation

## 2017-03-18 NOTE — Anesthesia Post-op Follow-up Note (Signed)
Anesthesia QCDR form completed.        

## 2017-03-18 NOTE — Op Note (Signed)
[facility] Patient Name: Kevin Kelley Procedure Date: 03/18/2017 1:44 PM MRN: [id number] Account #: [id number] Date of Birth: [birth year] Admit Type: Outpatient Age: 76 Room: [facility] ENDO ROOM 3 Gender: Male Note Status: Finalized Procedure:            Upper GI endoscopy Indications:          Dysphagia Providers:            Scot Jun, MD Medicines:            Propofol per Anesthesia Complications:        No immediate complications. Procedure:            Pre-Anesthesia Assessment:                       - After reviewing the risks and benefits, the patient                        was deemed in satisfactory condition to undergo the                        procedure.                       After obtaining informed consent, the endoscope was                        passed under direct vision. Throughout the procedure,                        the patient's blood pressure, pulse, and oxygen                        saturations were monitored continuously. The Endoscope                        was introduced through the mouth, and advanced to the                        second part of duodenum. The upper GI endoscopy was                        accomplished without difficulty. The patient tolerated                        the procedure well. Findings:      The examined esophagus was normal. GEJ 38cm.      Localized and patchy mild inflammation characterized by erythema and       granularity was found in the cardia, in the gastric body and in the       gastric antrum. Biopsies were taken with a cold forceps for histology.      The examined duodenum was normal. Biopsies were taken with a cold       forceps for histology.      Due to dysphagia as chief complaint I passed a guide wire and removed       the scope and passed a 77F and a 32F dilator with minimal resistance on       first dilator and less on the second. Impression:           - Normal esophagus.                     -  Gastritis. Biopsied.                       - Normal examined duodenum. Biopsied. Recommendation:       - Await pathology results. Scot Jun, MD 03/18/2017 2:25:48 PM This report has been signed electronically. Number of Addenda: 0 Note Initiated On: 03/18/2017 1:44 PM      [facility]

## 2017-03-19 ENCOUNTER — Encounter: Payer: Self-pay | Admitting: Unknown Physician Specialty

## 2017-03-21 LAB — SURGICAL PATHOLOGY

## 2017-05-11 ENCOUNTER — Emergency Department: Payer: Medicare PPO

## 2017-05-11 ENCOUNTER — Emergency Department
Admission: EM | Admit: 2017-05-11 | Discharge: 2017-05-11 | Disposition: A | Discharge status: Home / Self Care | Payer: Medicare PPO | Attending: Emergency Medicine | Admitting: Emergency Medicine

## 2017-05-11 ENCOUNTER — Other Ambulatory Visit: Payer: Self-pay

## 2017-05-11 DIAGNOSIS — Y929 Unspecified place or not applicable: Secondary | ICD-10-CM | POA: Insufficient documentation

## 2017-05-11 DIAGNOSIS — Y939 Activity, unspecified: Secondary | ICD-10-CM | POA: Diagnosis not present

## 2017-05-11 DIAGNOSIS — I2510 Atherosclerotic heart disease of native coronary artery without angina pectoris: Secondary | ICD-10-CM | POA: Diagnosis not present

## 2017-05-11 DIAGNOSIS — Z79899 Other long term (current) drug therapy: Secondary | ICD-10-CM | POA: Insufficient documentation

## 2017-05-11 DIAGNOSIS — I509 Heart failure, unspecified: Secondary | ICD-10-CM | POA: Diagnosis not present

## 2017-05-11 DIAGNOSIS — S0101XA Laceration without foreign body of scalp, initial encounter: Secondary | ICD-10-CM | POA: Diagnosis present

## 2017-05-11 DIAGNOSIS — Y998 Other external cause status: Secondary | ICD-10-CM | POA: Diagnosis not present

## 2017-05-11 DIAGNOSIS — W109XXA Fall (on) (from) unspecified stairs and steps, initial encounter: Secondary | ICD-10-CM | POA: Insufficient documentation

## 2017-05-11 DIAGNOSIS — S20229A Contusion of unspecified back wall of thorax, initial encounter: Secondary | ICD-10-CM | POA: Diagnosis not present

## 2017-05-11 DIAGNOSIS — I110 Hypertensive heart disease with heart failure: Secondary | ICD-10-CM | POA: Diagnosis not present

## 2017-05-11 DIAGNOSIS — Z7982 Long term (current) use of aspirin: Secondary | ICD-10-CM | POA: Diagnosis not present

## 2017-05-11 DIAGNOSIS — E119 Type 2 diabetes mellitus without complications: Secondary | ICD-10-CM | POA: Diagnosis not present

## 2017-05-11 DIAGNOSIS — Z7984 Long term (current) use of oral hypoglycemic drugs: Secondary | ICD-10-CM | POA: Insufficient documentation

## 2017-05-11 DIAGNOSIS — R0789 Other chest pain: Secondary | ICD-10-CM

## 2017-05-11 DIAGNOSIS — S0990XA Unspecified injury of head, initial encounter: Secondary | ICD-10-CM

## 2017-05-11 DIAGNOSIS — S1093XA Contusion of unspecified part of neck, initial encounter: Secondary | ICD-10-CM

## 2017-05-11 MED ORDER — LIDOCAINE-EPINEPHRINE (PF) 1 %-1:200000 IJ SOLN
INTRAMUSCULAR | Status: AC
  Administered 2017-05-11: 18:00:00 via INTRADERMAL
  Filled 2017-05-11: qty 30

## 2017-05-11 MED ORDER — OXYCODONE-ACETAMINOPHEN 5-325 MG PO TABS
1.0000 | ORAL_TABLET | Freq: Once | ORAL | Status: AC
  Administered 2017-05-11: 1 via ORAL

## 2017-05-11 MED ORDER — OXYCODONE-ACETAMINOPHEN 5-325 MG PO TABS
ORAL_TABLET | ORAL | Status: AC
  Administered 2017-05-11: 1 via ORAL
  Filled 2017-05-11: qty 1

## 2017-05-11 NOTE — ED Provider Notes (Signed)
[facility] Emergency Department Provider Note  ____________________________________________   First MD Initiated Contact with Patient 05/11/17 1649     (approximate)  I have reviewed the triage vital signs and the nursing notes.   HISTORY  Chief Complaint Fall and Laceration   HPI Kevin Kelley is a 76 y.o. male with a history of coronary artery disease as well as diabetes on aspirin who is presenting to the emergency department after fall.  He says that he tripped going up a flight of stairs and fell down 7 stairs backwards hitting the back of his head.  He denies losing consciousness but says that he has persistent pain in the back of his head.  Denies any dizziness, nausea or vomiting with pain to his midline cervical spine since the injury.  Also experiencing chest pain since injury as well although he did not impact his chest and denies any chest pain prior to the fall.  Patient sustained a laceration to the occipital parietal region that was noted in triage.  Last tetanus shot 1 year ago.  Past Medical History:  Diagnosis Date  . CHF (congestive heart failure) (HCC)   . Coronary artery disease   . Diabetes mellitus without complication (HCC)   . Hypertension   . MI (mitral incompetence)    three times    Patient Active Problem List   Diagnosis Date Noted  . Acute renal insufficiency 11/29/2016  . Intractable nausea and vomiting 11/29/2016  . Dehydration 11/29/2016  . Right upper quadrant abdominal pain 11/29/2016  . Hyponatremia 11/29/2016  . Hyperkalemia 11/28/2016    Past Surgical History:  Procedure Laterality Date  . CARDIAC CATHETERIZATION     twice  . ESOPHAGOGASTRODUODENOSCOPY (EGD) WITH PROPOFOL N/A 03/18/2017   Procedure: ESOPHAGOGASTRODUODENOSCOPY (EGD) WITH PROPOFOL;  Surgeon: Scot Jun, MD;  Location: [facility] ENDOSCOPY;  Service: Endoscopy;  Laterality: N/A;  . EYE SURGERY    . HERNIA REPAIR      Prior to Admission  medications   Medication Sig Start Date End Date Taking? Authorizing Provider  amLODipine (NORVASC) 10 MG tablet Take 10 mg by mouth daily.    [provider]  aspirin 81 MG tablet Take 81 mg by mouth daily.    [provider]  atorvastatin (LIPITOR) 40 MG tablet Take 40 mg by mouth daily.    [provider]  azithromycin (ZITHROMAX Z-PAK) 250 MG tablet Take 2 tablets (500 mg) on  Day 1,  followed by 1 tablet (250 mg) once daily on Days 2 through 5. Patient not taking: Reported on 03/18/2017 11/27/15   Sharman Cheek, MD  furosemide (LASIX) 20 MG tablet Take 20 mg by mouth daily.    [provider]  glipiZIDE (GLUCOTROL) 10 MG tablet Take 10 mg by mouth 2 (two) times daily before a meal.    [provider]  ibuprofen (ADVIL,MOTRIN) 200 MG tablet Take 200 mg by mouth.    [provider]  isosorbide mononitrate (IMDUR) 30 MG 24 hr tablet Take 30 mg by mouth daily.    [provider]  lisinopril (PRINIVIL,ZESTRIL) 20 MG tablet Take 20 mg by mouth daily.    [provider]  metFORMIN (GLUCOPHAGE) 1000 MG tablet Take 1,000 mg by mouth 2 (two) times daily with a meal.    [provider]  metoprolol (LOPRESSOR) 50 MG tablet Take 50 mg by mouth 2 (two) times daily.    [provider]  omeprazole (PRILOSEC) 20 MG capsule Take  20 mg by mouth daily.    [provider]  ondansetron (ZOFRAN) 4 MG tablet Take 1 tablet (4 mg total) by mouth every 6 (six) hours as needed for nausea. 11/29/16   Katharina CaperVaickute, Rima, MD  oxybutynin (DITROPAN-XL) 5 MG 24 hr tablet Take 5 mg by mouth at bedtime.    [provider]  pantoprazole (PROTONIX) 40 MG tablet Take 1 tablet (40 mg total) by mouth daily. 11/29/16   Katharina CaperVaickute, Rima, MD  pravastatin (PRAVACHOL) 40 MG tablet Take 40 mg by mouth daily.    [provider]  tamsulosin (FLOMAX) 0.4 MG CAPS capsule Take 0.4 mg by mouth.    [provider]  traMADol  (ULTRAM) 50 MG tablet Take 1 tablet (50 mg total) by mouth every 6 (six) hours as needed. 05/07/15   Tommi RumpsSummers, Rhonda L, PA-C    Allergies Codeine  Family History  Problem Relation Age of Onset  . Hypertension Mother   . Stroke Mother   . Hypertension Father   . Stroke Father   . Cancer Sister   . Diabetes Mellitus II Sister   . Alcohol abuse Brother   . Cancer Brother   . CAD Brother   . Diabetes Mellitus II Sister     Social History Social History   Tobacco Use  . Smoking status: Never Smoker  . Smokeless tobacco: Current User    Types: Chew  Substance Use Topics  . Alcohol use: No  . Drug use: No    Review of Systems  Constitutional: No fever/chills Eyes: No visual changes. ENT: No sore throat. Cardiovascular: As above Respiratory: Denies shortness of breath. Gastrointestinal: No abdominal pain.  No nausea, no vomiting.  No diarrhea.  No constipation. Genitourinary: Negative for dysuria. Musculoskeletal: Negative for back pain. Skin: Negative for rash. Neurological: Negative for focal weakness or numbness.   ____________________________________________   PHYSICAL EXAM:  VITAL SIGNS: ED Triage Vitals  Enc Vitals Group     BP 05/11/17 1621 (!) 157/68     Pulse Rate 05/11/17 1621 64     Resp 05/11/17 1621 16     Temp 05/11/17 1621 97.9 F (36.6 C)     Temp Source 05/11/17 1621 Oral     SpO2 05/11/17 1621 96 %     Weight 05/11/17 1622 186 lb (84.4 kg)     Height 05/11/17 1622 5\' 7"  (1.702 m)     Head Circumference --      Peak Flow --      Pain Score 05/11/17 1621 10     Pain Loc --      Pain Edu? --      Excl. in GC? --     Constitutional: Alert and oriented. Well appearing and in no acute distress. Eyes: Conjunctivae are normal.  Head: 7 cm laceration in a vertical orientation to the occiput and parietal region just about the midline.  Extends about 3 mm deep and does not extend down to the galea. Nose: No congestion/rhinnorhea. Mouth/Throat:  Mucous membranes are moist.  Neck: No stridor.  Mild tenderness to palpation along the distribution of the midline cervical spine without any deformity or step-off.  Patient appears to be raising his head neck freely without any restriction. Cardiovascular: Normal rate, regular rhythm. Grossly normal heart sounds.  Patient with reproducible chest pain over the sternum.  No deformity, step-off or crepitus. Respiratory: Normal respiratory effort.  No retractions. Lungs CTAB. Gastrointestinal: Soft and nontender. No distention. No CVA tenderness. Musculoskeletal: No  lower extremity tenderness nor edema.  No joint effusions. Neurologic:  Normal speech and language. No gross focal neurologic deficits are appreciated. Skin:  Skin is warm, dry and intact. No rash noted. Psychiatric: Mood and affect are normal. Speech and behavior are normal.  ____________________________________________   LABS (all labs ordered are listed, but only abnormal results are displayed)  Labs Reviewed - No data to display ____________________________________________  EKG  ED ECG REPORT I, Arelia Longest, the attending physician, personally viewed and interpreted this ECG.   Date: 05/11/2017  EKG Time: 1624  Rate: 62  Rhythm: normal sinus rhythm  Axis: Normal  Intervals:right bundle branch block  ST&T Change: No ST segment elevation or depression.  Biphasic T wave in V2 as well as V3.  No significant change from previous EKGs on the record.  ____________________________________________  RADIOLOGY  No acute intracranial abnormality or acute cervical spine fracture.  Chest x-ray without any acute pathology. ____________________________________________   PROCEDURES  Procedure(s) performed:     Marland KitchenMarland KitchenLaceration Repair Date/Time: 05/11/2017 6:24 PM Performed by: Myrna Blazer, MD Authorized by: Myrna Blazer, MD   Consent:    Consent obtained:  Verbal   Consent given by:  Patient    Risks discussed:  Infection, pain, poor cosmetic result and poor wound healing   Alternatives discussed:  No treatment Anesthesia (see MAR for exact dosages):    Anesthesia method:  Local infiltration   Local anesthetic:  Lidocaine 1% WITH epi Laceration details:    Location:  Scalp   Scalp location:  Occipital   Length (cm):  7   Depth (mm):  3 Repair type:    Repair type:  Simple Pre-procedure details:    Preparation:  Patient was prepped and draped in usual sterile fashion and imaging obtained to evaluate for foreign bodies Exploration:    Hemostasis achieved with:  Epinephrine   Wound exploration: wound explored through full range of motion and entire depth of wound probed and visualized     Contaminated: no   Treatment:    Area cleansed with:  Saline   Amount of cleaning:  Standard   Irrigation solution:  Sterile saline   Irrigation volume:  150cc   Irrigation method:  Pressure wash   Visualized foreign bodies/material removed: no   Skin repair:    Repair method:  Staples   Number of staples:  4 Approximation:    Approximation:  Close   Vermilion border: well-aligned   Post-procedure details:    Dressing:  Sterile dressing   Patient tolerance of procedure:  Tolerated well, no immediate complications    Critical Care performed: No  ____________________________________________   INITIAL IMPRESSION / ASSESSMENT AND PLAN / ED COURSE  Pertinent labs & imaging results that were available during my care of the patient were reviewed by me and considered in my medical decision making (see chart for details).  DDX: Laceration, skull fracture, galea laceration, cervical spine fracture, sternal fracture, MI.  As part of my medical decision making, I reviewed the following data within the electronic MEDICAL RECORD NUMBER Old chart reviewed  ----------------------------------------- 6:26 PM on 05/11/2017 -----------------------------------------  Patient with reassuring  imaging.  Chest pain is reproducible to palpation in the EKG is unchanged.  Likely related to the fall itself.  Patient instructed to have the staples taken out in 7-10 days.  He tolerated the procedure well.  Will be discharged home at this time.  He is understanding of the plan willing to comply.  ____________________________________________   FINAL CLINICAL IMPRESSION(S) / ED DIAGNOSES  Chest wall pain.  Contusion to the cervical spine.  Occipital laceration.    NEW MEDICATIONS STARTED DURING THIS VISIT:  This SmartLink is deprecated. Use AVSMEDLIST instead to display the medication list for a patient.   Note:  This document was prepared using Dragon voice recognition software and may include unintentional dictation errors.     Myrna BlazerSchaevitz, David Matthew, MD 05/11/17 [phone]

## 2017-05-11 NOTE — ED Triage Notes (Addendum)
Pt to ER via POV after fall backwards onto cement. Pt has vertical laceration to posterior head. Skin abrasions to right arm. No LOC. Pt takes ASA only, no other blood thinners per his report. Pt alert and oriented X4, active, cooperative, pt in NAD. RR even and unlabored, color WNL.   Pt also c/o sternal pain that started after falling.

## 2019-04-25 IMAGING — CT CT HEAD W/O CM
3 series · 15 of 47 positions shown, 18 images · non-contrast
Comparison: MRI brain dated 07/27/2009.  CT head dated 07/26/2009.

CLINICAL DATA: Head trauma, fall, ataxia, posterior head laceration

EXAM:
CT HEAD WITHOUT CONTRAST
TECHNIQUE: Contiguous axial images were obtained from the base of the skull
through the vertex without intravenous contrast.

[Series 3: head wo · axial · 0.47mm/px · z∈[-124,+6]mm · 9 of 32 slices shown, 12 images]
[im 3/32  brain]
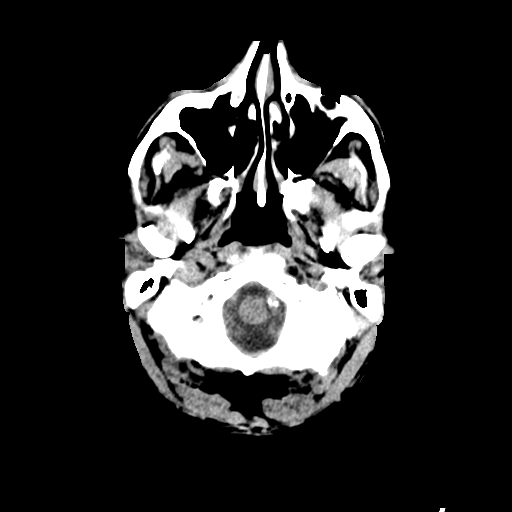
[im 3/32  bone]
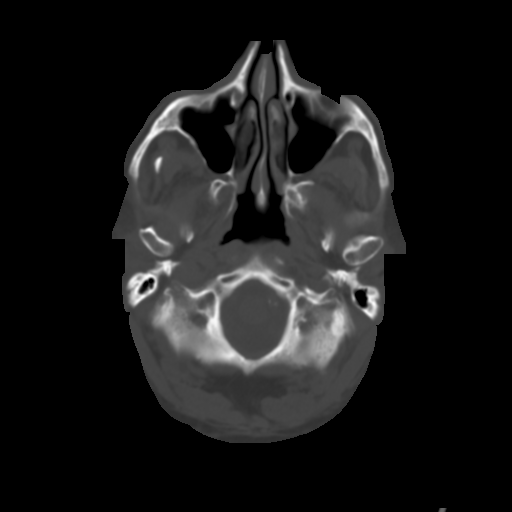
[im 6/32  brain]
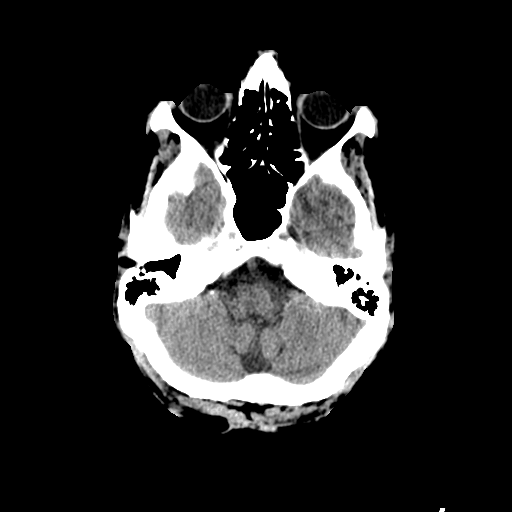
[im 9/32  brain]
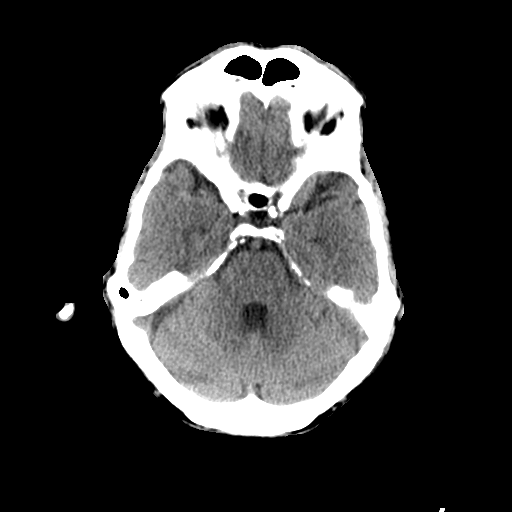
[im 12/32  brain]
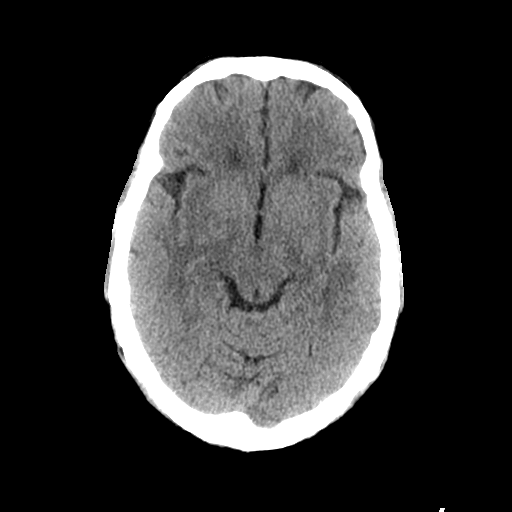
[im 17/32  brain]
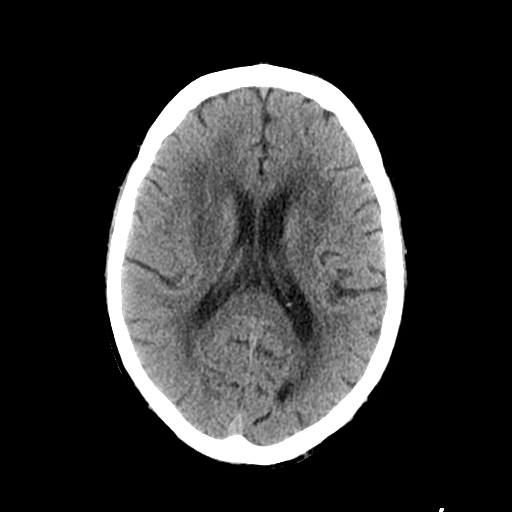
[im 17/32  bone]
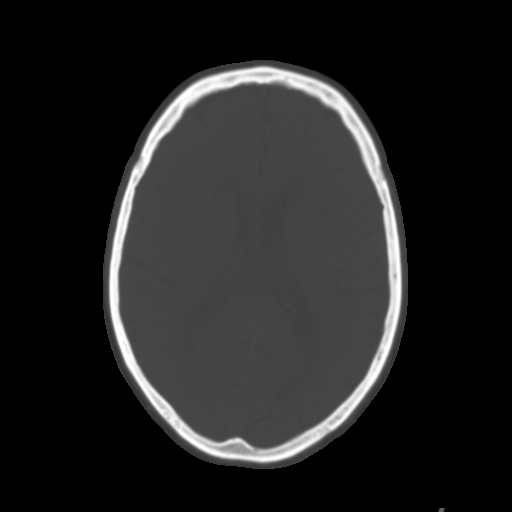
[im 20/32  brain]
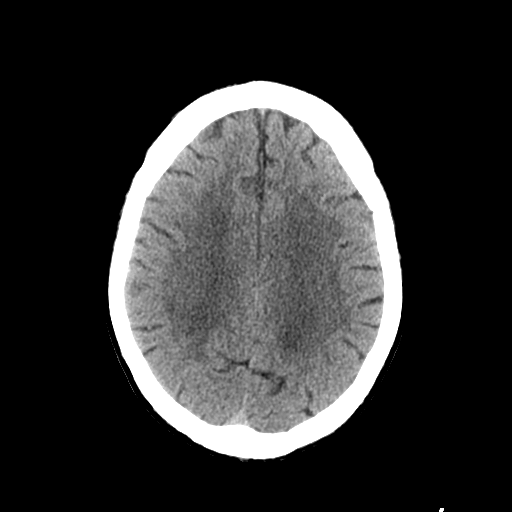
[im 23/32  brain]
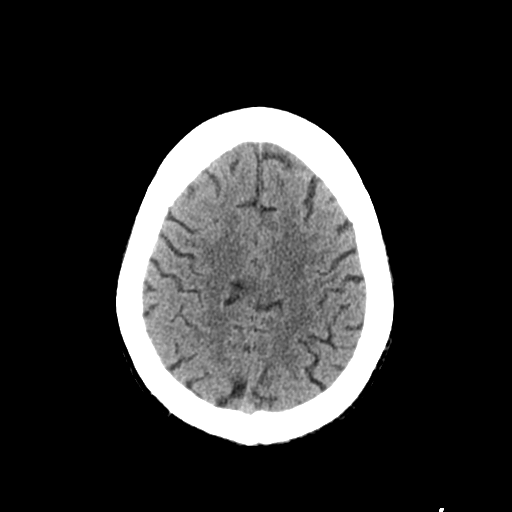
[im 26/32  brain]
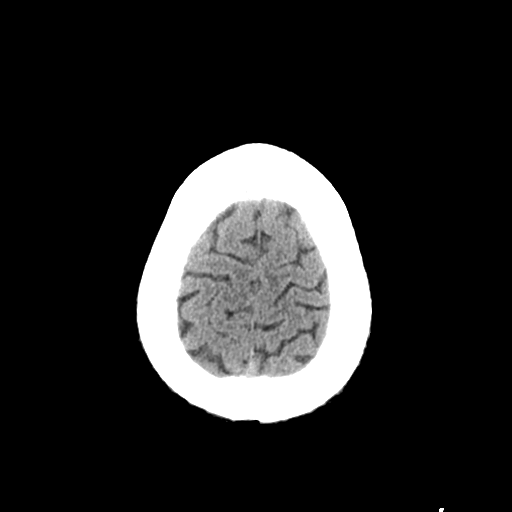
[im 29/32  brain]
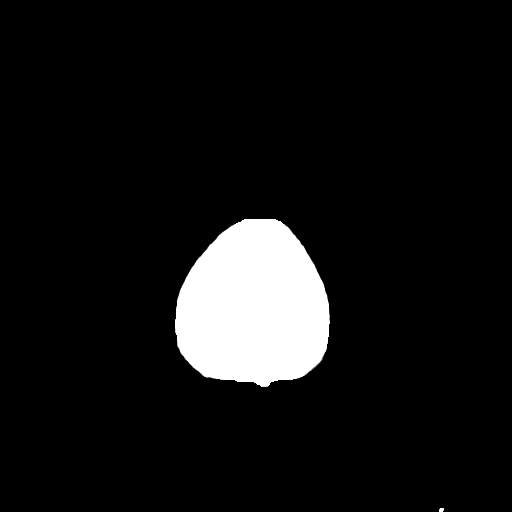
[im 29/32  bone]
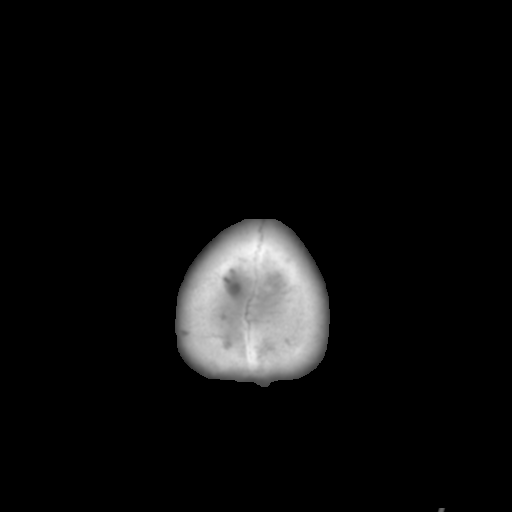

[Series 4: coronal soft tissue · coronal · 0.32mm/px · 3 of 70 slices shown]
[im 24/70  brain]
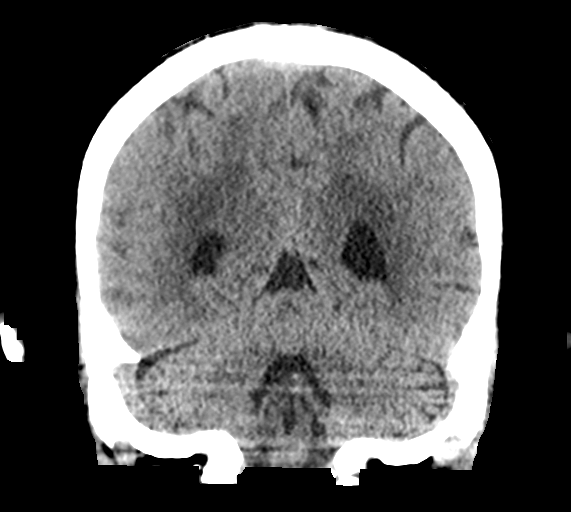
[im 31/70  brain]
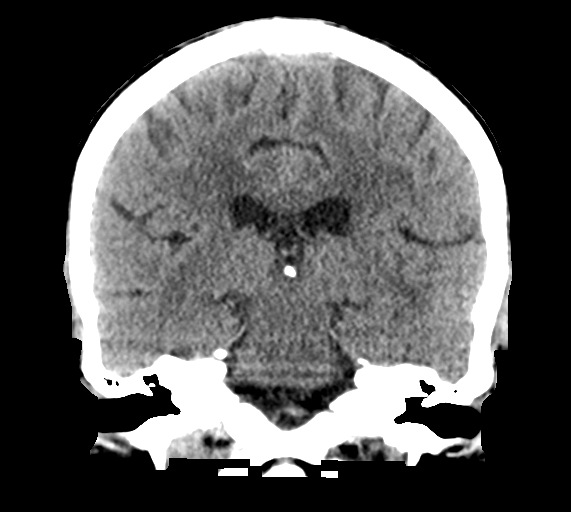
[im 39/70  brain]
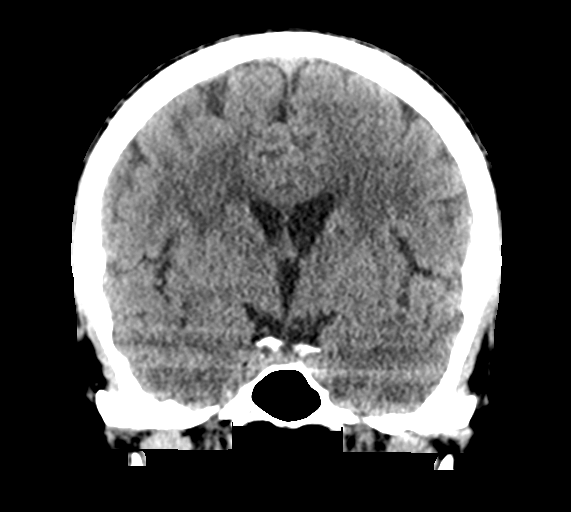

[Series 5: sagittal soft tissue · sagittal · 0.33mm/px · 3 of 53 slices shown]
[im 18/53  brain]
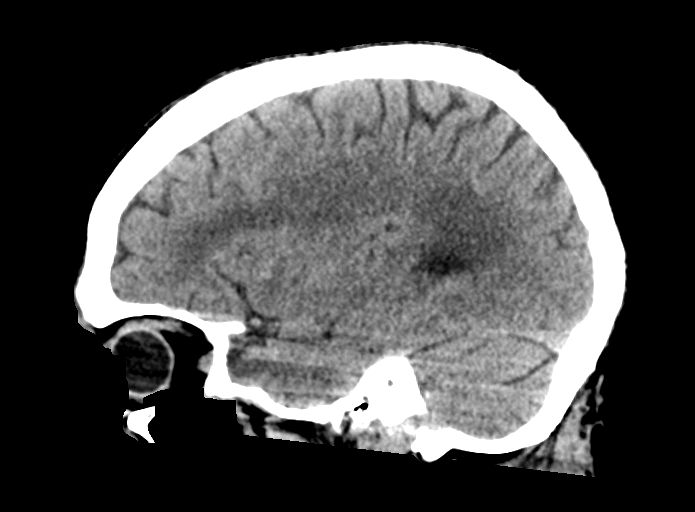
[im 27/53  brain]
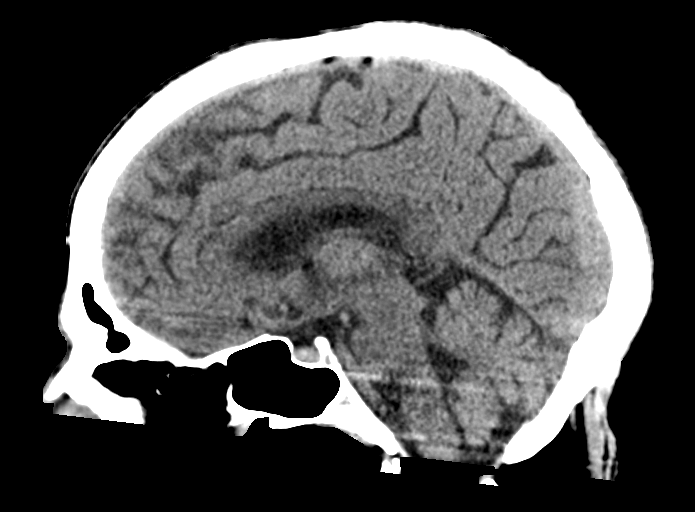
[im 35/53  brain]
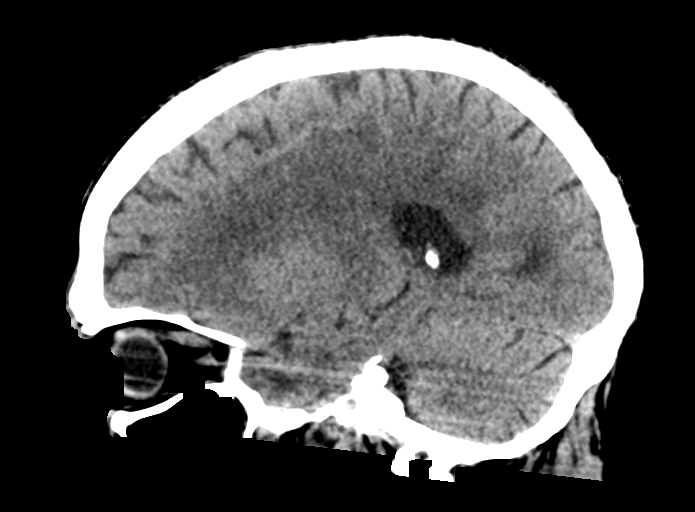

[15 of 47 positions shown; findings below may reference images not displayed]

FINDINGS: Brain: No evidence of acute infarction, hemorrhage, hydrocephalus,
extra-axial collection or mass lesion/mass effect.

Subcortical white matter and periventricular small vessel ischemic
changes.

Vascular: Mild intracranial atherosclerosis.

Skull: Normal. Negative for fracture or focal lesion.

Sinuses/Orbits: The visualized paranasal sinuses are essentially
clear. The mastoid air cells are unopacified.

Other: Soft tissue laceration overlying the midline posterior
vertex/occiput.
IMPRESSION: Soft tissue laceration overlying the midline posterior
vertex/occiput. No evidence of calvarial fracture.

No evidence of acute intracranial abnormality. Small vessel ischemic
changes.

## 2020-01-08 DEATH — deceased
